# Patient Record
Sex: Male | Born: 1935 | Race: White | Hispanic: No | Marital: Single | Smoking: Former smoker
Health system: Southern US, Community
[De-identification: ages and names within clinical notes are randomized; demographics above are authoritative.]

## PROBLEM LIST (undated history)

## (undated) DIAGNOSIS — I10 Essential (primary) hypertension: Secondary | ICD-10-CM

## (undated) DIAGNOSIS — G4730 Sleep apnea, unspecified: Secondary | ICD-10-CM

## (undated) DIAGNOSIS — J449 Chronic obstructive pulmonary disease, unspecified: Secondary | ICD-10-CM

## (undated) DIAGNOSIS — J189 Pneumonia, unspecified organism: Secondary | ICD-10-CM

## (undated) DIAGNOSIS — M109 Gout, unspecified: Secondary | ICD-10-CM

## (undated) DIAGNOSIS — I499 Cardiac arrhythmia, unspecified: Secondary | ICD-10-CM

## (undated) HISTORY — PX: PROSTATE SURGERY: SHX751

## (undated) HISTORY — PX: KNEE SURGERY: SHX244

## (undated) HISTORY — DX: Pneumonia, unspecified organism: J18.9

## (undated) HISTORY — PX: BACK SURGERY: SHX140

---

## 2005-03-17 ENCOUNTER — Ambulatory Visit (HOSPITAL_BASED_OUTPATIENT_CLINIC_OR_DEPARTMENT_OTHER): Admission: RE | Admit: 2005-03-17 | Discharge: 2005-03-17 | Payer: Self-pay | Admitting: Orthopedic Surgery

## 2006-02-14 ENCOUNTER — Ambulatory Visit: Admission: RE | Admit: 2006-02-14 | Discharge: 2006-02-14 | Payer: Self-pay | Admitting: Orthopedic Surgery

## 2006-02-18 ENCOUNTER — Encounter: Admission: RE | Admit: 2006-02-18 | Discharge: 2006-02-18 | Payer: Self-pay | Admitting: Orthopedic Surgery

## 2006-05-30 ENCOUNTER — Inpatient Hospital Stay (HOSPITAL_COMMUNITY): Admission: RE | Admit: 2006-05-30 | Discharge: 2006-06-02 | Payer: Self-pay | Admitting: Orthopedic Surgery

## 2014-02-07 DIAGNOSIS — R251 Tremor, unspecified: Secondary | ICD-10-CM | POA: Diagnosis not present

## 2014-02-07 DIAGNOSIS — I4891 Unspecified atrial fibrillation: Secondary | ICD-10-CM | POA: Diagnosis not present

## 2014-02-07 DIAGNOSIS — J019 Acute sinusitis, unspecified: Secondary | ICD-10-CM | POA: Diagnosis not present

## 2014-02-07 DIAGNOSIS — L989 Disorder of the skin and subcutaneous tissue, unspecified: Secondary | ICD-10-CM | POA: Diagnosis not present

## 2014-04-15 DIAGNOSIS — Z6828 Body mass index (BMI) 28.0-28.9, adult: Secondary | ICD-10-CM | POA: Diagnosis not present

## 2014-04-15 DIAGNOSIS — H6120 Impacted cerumen, unspecified ear: Secondary | ICD-10-CM | POA: Diagnosis not present

## 2014-04-15 DIAGNOSIS — I4891 Unspecified atrial fibrillation: Secondary | ICD-10-CM | POA: Diagnosis not present

## 2014-04-24 DIAGNOSIS — Z0100 Encounter for examination of eyes and vision without abnormal findings: Secondary | ICD-10-CM | POA: Diagnosis not present

## 2014-05-16 DIAGNOSIS — E782 Mixed hyperlipidemia: Secondary | ICD-10-CM | POA: Diagnosis not present

## 2014-05-16 DIAGNOSIS — J449 Chronic obstructive pulmonary disease, unspecified: Secondary | ICD-10-CM | POA: Diagnosis not present

## 2014-05-16 DIAGNOSIS — I4891 Unspecified atrial fibrillation: Secondary | ICD-10-CM | POA: Diagnosis not present

## 2014-05-16 DIAGNOSIS — M109 Gout, unspecified: Secondary | ICD-10-CM | POA: Diagnosis not present

## 2014-05-30 DIAGNOSIS — Z6828 Body mass index (BMI) 28.0-28.9, adult: Secondary | ICD-10-CM | POA: Diagnosis not present

## 2014-06-04 DIAGNOSIS — K824 Cholesterolosis of gallbladder: Secondary | ICD-10-CM | POA: Diagnosis not present

## 2014-06-04 DIAGNOSIS — R7989 Other specified abnormal findings of blood chemistry: Secondary | ICD-10-CM | POA: Diagnosis not present

## 2014-06-04 DIAGNOSIS — R748 Abnormal levels of other serum enzymes: Secondary | ICD-10-CM | POA: Diagnosis not present

## 2014-06-17 DIAGNOSIS — I4891 Unspecified atrial fibrillation: Secondary | ICD-10-CM | POA: Diagnosis not present

## 2014-07-15 DIAGNOSIS — I4891 Unspecified atrial fibrillation: Secondary | ICD-10-CM | POA: Diagnosis not present

## 2014-07-19 DIAGNOSIS — Z6828 Body mass index (BMI) 28.0-28.9, adult: Secondary | ICD-10-CM | POA: Diagnosis not present

## 2014-08-14 DIAGNOSIS — E782 Mixed hyperlipidemia: Secondary | ICD-10-CM | POA: Diagnosis not present

## 2014-09-09 DIAGNOSIS — M1000 Idiopathic gout, unspecified site: Secondary | ICD-10-CM | POA: Diagnosis not present

## 2014-09-09 DIAGNOSIS — E782 Mixed hyperlipidemia: Secondary | ICD-10-CM | POA: Diagnosis not present

## 2014-09-09 DIAGNOSIS — J449 Chronic obstructive pulmonary disease, unspecified: Secondary | ICD-10-CM | POA: Diagnosis not present

## 2014-09-09 DIAGNOSIS — I4891 Unspecified atrial fibrillation: Secondary | ICD-10-CM | POA: Diagnosis not present

## 2014-10-15 DIAGNOSIS — I4891 Unspecified atrial fibrillation: Secondary | ICD-10-CM | POA: Diagnosis not present

## 2014-10-15 DIAGNOSIS — Z6827 Body mass index (BMI) 27.0-27.9, adult: Secondary | ICD-10-CM | POA: Diagnosis not present

## 2014-10-15 DIAGNOSIS — D485 Neoplasm of uncertain behavior of skin: Secondary | ICD-10-CM | POA: Diagnosis not present

## 2014-10-24 DIAGNOSIS — H3530 Unspecified macular degeneration: Secondary | ICD-10-CM | POA: Diagnosis not present

## 2014-11-14 DIAGNOSIS — Z139 Encounter for screening, unspecified: Secondary | ICD-10-CM | POA: Diagnosis not present

## 2014-11-14 DIAGNOSIS — I4891 Unspecified atrial fibrillation: Secondary | ICD-10-CM | POA: Diagnosis not present

## 2014-11-14 DIAGNOSIS — Z0000 Encounter for general adult medical examination without abnormal findings: Secondary | ICD-10-CM | POA: Diagnosis not present

## 2014-11-14 DIAGNOSIS — Z1389 Encounter for screening for other disorder: Secondary | ICD-10-CM | POA: Diagnosis not present

## 2014-11-14 DIAGNOSIS — Z9181 History of falling: Secondary | ICD-10-CM | POA: Diagnosis not present

## 2014-11-14 DIAGNOSIS — Z23 Encounter for immunization: Secondary | ICD-10-CM | POA: Diagnosis not present

## 2014-12-31 DIAGNOSIS — I4891 Unspecified atrial fibrillation: Secondary | ICD-10-CM | POA: Diagnosis not present

## 2015-01-13 DIAGNOSIS — J440 Chronic obstructive pulmonary disease with acute lower respiratory infection: Secondary | ICD-10-CM | POA: Diagnosis not present

## 2015-01-13 DIAGNOSIS — M109 Gout, unspecified: Secondary | ICD-10-CM | POA: Diagnosis not present

## 2015-01-13 DIAGNOSIS — R7301 Impaired fasting glucose: Secondary | ICD-10-CM | POA: Diagnosis not present

## 2015-01-13 DIAGNOSIS — I4891 Unspecified atrial fibrillation: Secondary | ICD-10-CM | POA: Diagnosis not present

## 2015-02-06 DIAGNOSIS — I4891 Unspecified atrial fibrillation: Secondary | ICD-10-CM | POA: Diagnosis not present

## 2015-03-11 DIAGNOSIS — I4891 Unspecified atrial fibrillation: Secondary | ICD-10-CM | POA: Diagnosis not present

## 2015-03-31 DIAGNOSIS — I4891 Unspecified atrial fibrillation: Secondary | ICD-10-CM | POA: Diagnosis not present

## 2015-04-02 DIAGNOSIS — R079 Chest pain, unspecified: Secondary | ICD-10-CM | POA: Diagnosis not present

## 2015-04-02 DIAGNOSIS — J8410 Pulmonary fibrosis, unspecified: Secondary | ICD-10-CM | POA: Diagnosis not present

## 2015-04-02 DIAGNOSIS — R05 Cough: Secondary | ICD-10-CM | POA: Diagnosis not present

## 2015-04-02 DIAGNOSIS — I517 Cardiomegaly: Secondary | ICD-10-CM | POA: Diagnosis not present

## 2015-04-08 DIAGNOSIS — R0602 Shortness of breath: Secondary | ICD-10-CM | POA: Diagnosis not present

## 2015-04-08 DIAGNOSIS — I4891 Unspecified atrial fibrillation: Secondary | ICD-10-CM | POA: Diagnosis not present

## 2015-04-08 DIAGNOSIS — R531 Weakness: Secondary | ICD-10-CM | POA: Diagnosis not present

## 2015-04-08 DIAGNOSIS — R509 Fever, unspecified: Secondary | ICD-10-CM | POA: Diagnosis not present

## 2015-04-15 DIAGNOSIS — Z6827 Body mass index (BMI) 27.0-27.9, adult: Secondary | ICD-10-CM | POA: Diagnosis not present

## 2015-04-15 DIAGNOSIS — J441 Chronic obstructive pulmonary disease with (acute) exacerbation: Secondary | ICD-10-CM | POA: Diagnosis not present

## 2015-04-15 DIAGNOSIS — J208 Acute bronchitis due to other specified organisms: Secondary | ICD-10-CM | POA: Diagnosis not present

## 2015-04-21 DIAGNOSIS — J9690 Respiratory failure, unspecified, unspecified whether with hypoxia or hypercapnia: Secondary | ICD-10-CM | POA: Diagnosis not present

## 2015-04-21 DIAGNOSIS — R0602 Shortness of breath: Secondary | ICD-10-CM | POA: Diagnosis not present

## 2015-04-21 DIAGNOSIS — J690 Pneumonitis due to inhalation of food and vomit: Secondary | ICD-10-CM | POA: Diagnosis not present

## 2015-04-21 DIAGNOSIS — R7989 Other specified abnormal findings of blood chemistry: Secondary | ICD-10-CM | POA: Diagnosis not present

## 2015-04-21 DIAGNOSIS — I509 Heart failure, unspecified: Secondary | ICD-10-CM | POA: Diagnosis not present

## 2015-04-21 DIAGNOSIS — J9601 Acute respiratory failure with hypoxia: Secondary | ICD-10-CM | POA: Diagnosis not present

## 2015-04-21 DIAGNOSIS — I482 Chronic atrial fibrillation: Secondary | ICD-10-CM | POA: Diagnosis not present

## 2015-04-21 DIAGNOSIS — A419 Sepsis, unspecified organism: Secondary | ICD-10-CM | POA: Diagnosis not present

## 2015-04-21 DIAGNOSIS — J189 Pneumonia, unspecified organism: Secondary | ICD-10-CM | POA: Diagnosis not present

## 2015-04-21 DIAGNOSIS — J440 Chronic obstructive pulmonary disease with acute lower respiratory infection: Secondary | ICD-10-CM | POA: Diagnosis not present

## 2015-04-21 DIAGNOSIS — R6520 Severe sepsis without septic shock: Secondary | ICD-10-CM | POA: Diagnosis not present

## 2015-04-21 DIAGNOSIS — I10 Essential (primary) hypertension: Secondary | ICD-10-CM | POA: Diagnosis not present

## 2015-04-21 DIAGNOSIS — J9620 Acute and chronic respiratory failure, unspecified whether with hypoxia or hypercapnia: Secondary | ICD-10-CM | POA: Diagnosis not present

## 2015-04-21 DIAGNOSIS — E7800 Pure hypercholesterolemia, unspecified: Secondary | ICD-10-CM | POA: Diagnosis not present

## 2015-04-21 DIAGNOSIS — J441 Chronic obstructive pulmonary disease with (acute) exacerbation: Secondary | ICD-10-CM | POA: Diagnosis not present

## 2015-04-28 DIAGNOSIS — M5432 Sciatica, left side: Secondary | ICD-10-CM | POA: Diagnosis not present

## 2015-04-28 DIAGNOSIS — J690 Pneumonitis due to inhalation of food and vomit: Secondary | ICD-10-CM | POA: Diagnosis not present

## 2015-04-28 DIAGNOSIS — M150 Primary generalized (osteo)arthritis: Secondary | ICD-10-CM | POA: Diagnosis not present

## 2015-04-28 DIAGNOSIS — I4891 Unspecified atrial fibrillation: Secondary | ICD-10-CM | POA: Diagnosis not present

## 2015-04-28 DIAGNOSIS — J440 Chronic obstructive pulmonary disease with acute lower respiratory infection: Secondary | ICD-10-CM | POA: Diagnosis not present

## 2015-04-28 DIAGNOSIS — J9601 Acute respiratory failure with hypoxia: Secondary | ICD-10-CM | POA: Diagnosis not present

## 2015-04-28 DIAGNOSIS — R2681 Unsteadiness on feet: Secondary | ICD-10-CM | POA: Diagnosis not present

## 2015-04-28 DIAGNOSIS — S50912D Unspecified superficial injury of left forearm, subsequent encounter: Secondary | ICD-10-CM | POA: Diagnosis not present

## 2015-04-28 DIAGNOSIS — J441 Chronic obstructive pulmonary disease with (acute) exacerbation: Secondary | ICD-10-CM | POA: Diagnosis not present

## 2015-04-29 DIAGNOSIS — J441 Chronic obstructive pulmonary disease with (acute) exacerbation: Secondary | ICD-10-CM | POA: Diagnosis not present

## 2015-04-29 DIAGNOSIS — J690 Pneumonitis due to inhalation of food and vomit: Secondary | ICD-10-CM | POA: Diagnosis not present

## 2015-04-29 DIAGNOSIS — Z7901 Long term (current) use of anticoagulants: Secondary | ICD-10-CM | POA: Diagnosis not present

## 2015-04-29 DIAGNOSIS — S50912D Unspecified superficial injury of left forearm, subsequent encounter: Secondary | ICD-10-CM | POA: Diagnosis not present

## 2015-04-29 DIAGNOSIS — J9601 Acute respiratory failure with hypoxia: Secondary | ICD-10-CM | POA: Diagnosis not present

## 2015-04-29 DIAGNOSIS — I4891 Unspecified atrial fibrillation: Secondary | ICD-10-CM | POA: Diagnosis not present

## 2015-04-29 DIAGNOSIS — J440 Chronic obstructive pulmonary disease with acute lower respiratory infection: Secondary | ICD-10-CM | POA: Diagnosis not present

## 2015-04-29 DIAGNOSIS — M5432 Sciatica, left side: Secondary | ICD-10-CM | POA: Diagnosis not present

## 2015-04-29 DIAGNOSIS — R2681 Unsteadiness on feet: Secondary | ICD-10-CM | POA: Diagnosis not present

## 2015-04-29 DIAGNOSIS — Z79899 Other long term (current) drug therapy: Secondary | ICD-10-CM | POA: Diagnosis not present

## 2015-04-29 DIAGNOSIS — M150 Primary generalized (osteo)arthritis: Secondary | ICD-10-CM | POA: Diagnosis not present

## 2015-04-30 DIAGNOSIS — S50912D Unspecified superficial injury of left forearm, subsequent encounter: Secondary | ICD-10-CM | POA: Diagnosis not present

## 2015-04-30 DIAGNOSIS — J441 Chronic obstructive pulmonary disease with (acute) exacerbation: Secondary | ICD-10-CM | POA: Diagnosis not present

## 2015-04-30 DIAGNOSIS — S51812D Laceration without foreign body of left forearm, subsequent encounter: Secondary | ICD-10-CM | POA: Diagnosis not present

## 2015-04-30 DIAGNOSIS — J440 Chronic obstructive pulmonary disease with acute lower respiratory infection: Secondary | ICD-10-CM | POA: Diagnosis not present

## 2015-04-30 DIAGNOSIS — M5432 Sciatica, left side: Secondary | ICD-10-CM | POA: Diagnosis not present

## 2015-04-30 DIAGNOSIS — I4891 Unspecified atrial fibrillation: Secondary | ICD-10-CM | POA: Diagnosis not present

## 2015-04-30 DIAGNOSIS — J690 Pneumonitis due to inhalation of food and vomit: Secondary | ICD-10-CM | POA: Diagnosis not present

## 2015-04-30 DIAGNOSIS — J9601 Acute respiratory failure with hypoxia: Secondary | ICD-10-CM | POA: Diagnosis not present

## 2015-04-30 DIAGNOSIS — J159 Unspecified bacterial pneumonia: Secondary | ICD-10-CM | POA: Diagnosis not present

## 2015-04-30 DIAGNOSIS — M150 Primary generalized (osteo)arthritis: Secondary | ICD-10-CM | POA: Diagnosis not present

## 2015-04-30 DIAGNOSIS — R2681 Unsteadiness on feet: Secondary | ICD-10-CM | POA: Diagnosis not present

## 2015-05-02 DIAGNOSIS — M5432 Sciatica, left side: Secondary | ICD-10-CM | POA: Diagnosis not present

## 2015-05-02 DIAGNOSIS — J441 Chronic obstructive pulmonary disease with (acute) exacerbation: Secondary | ICD-10-CM | POA: Diagnosis not present

## 2015-05-02 DIAGNOSIS — J690 Pneumonitis due to inhalation of food and vomit: Secondary | ICD-10-CM | POA: Diagnosis not present

## 2015-05-02 DIAGNOSIS — M150 Primary generalized (osteo)arthritis: Secondary | ICD-10-CM | POA: Diagnosis not present

## 2015-05-02 DIAGNOSIS — J440 Chronic obstructive pulmonary disease with acute lower respiratory infection: Secondary | ICD-10-CM | POA: Diagnosis not present

## 2015-05-02 DIAGNOSIS — R2681 Unsteadiness on feet: Secondary | ICD-10-CM | POA: Diagnosis not present

## 2015-05-02 DIAGNOSIS — J9601 Acute respiratory failure with hypoxia: Secondary | ICD-10-CM | POA: Diagnosis not present

## 2015-05-02 DIAGNOSIS — S50912D Unspecified superficial injury of left forearm, subsequent encounter: Secondary | ICD-10-CM | POA: Diagnosis not present

## 2015-05-02 DIAGNOSIS — I4891 Unspecified atrial fibrillation: Secondary | ICD-10-CM | POA: Diagnosis not present

## 2015-05-05 DIAGNOSIS — Z7901 Long term (current) use of anticoagulants: Secondary | ICD-10-CM | POA: Diagnosis not present

## 2015-05-05 DIAGNOSIS — R2681 Unsteadiness on feet: Secondary | ICD-10-CM | POA: Diagnosis not present

## 2015-05-05 DIAGNOSIS — M150 Primary generalized (osteo)arthritis: Secondary | ICD-10-CM | POA: Diagnosis not present

## 2015-05-05 DIAGNOSIS — J690 Pneumonitis due to inhalation of food and vomit: Secondary | ICD-10-CM | POA: Diagnosis not present

## 2015-05-05 DIAGNOSIS — J441 Chronic obstructive pulmonary disease with (acute) exacerbation: Secondary | ICD-10-CM | POA: Diagnosis not present

## 2015-05-05 DIAGNOSIS — I4891 Unspecified atrial fibrillation: Secondary | ICD-10-CM | POA: Diagnosis not present

## 2015-05-05 DIAGNOSIS — J440 Chronic obstructive pulmonary disease with acute lower respiratory infection: Secondary | ICD-10-CM | POA: Diagnosis not present

## 2015-05-05 DIAGNOSIS — J9601 Acute respiratory failure with hypoxia: Secondary | ICD-10-CM | POA: Diagnosis not present

## 2015-05-05 DIAGNOSIS — S50912D Unspecified superficial injury of left forearm, subsequent encounter: Secondary | ICD-10-CM | POA: Diagnosis not present

## 2015-05-05 DIAGNOSIS — M5432 Sciatica, left side: Secondary | ICD-10-CM | POA: Diagnosis not present

## 2015-05-06 DIAGNOSIS — I517 Cardiomegaly: Secondary | ICD-10-CM | POA: Diagnosis not present

## 2015-05-06 DIAGNOSIS — J90 Pleural effusion, not elsewhere classified: Secondary | ICD-10-CM | POA: Diagnosis not present

## 2015-05-06 DIAGNOSIS — J189 Pneumonia, unspecified organism: Secondary | ICD-10-CM | POA: Diagnosis not present

## 2015-05-07 DIAGNOSIS — M150 Primary generalized (osteo)arthritis: Secondary | ICD-10-CM | POA: Diagnosis not present

## 2015-05-07 DIAGNOSIS — S50912D Unspecified superficial injury of left forearm, subsequent encounter: Secondary | ICD-10-CM | POA: Diagnosis not present

## 2015-05-07 DIAGNOSIS — J441 Chronic obstructive pulmonary disease with (acute) exacerbation: Secondary | ICD-10-CM | POA: Diagnosis not present

## 2015-05-07 DIAGNOSIS — M5432 Sciatica, left side: Secondary | ICD-10-CM | POA: Diagnosis not present

## 2015-05-07 DIAGNOSIS — J440 Chronic obstructive pulmonary disease with acute lower respiratory infection: Secondary | ICD-10-CM | POA: Diagnosis not present

## 2015-05-07 DIAGNOSIS — I4891 Unspecified atrial fibrillation: Secondary | ICD-10-CM | POA: Diagnosis not present

## 2015-05-07 DIAGNOSIS — J690 Pneumonitis due to inhalation of food and vomit: Secondary | ICD-10-CM | POA: Diagnosis not present

## 2015-05-07 DIAGNOSIS — R2681 Unsteadiness on feet: Secondary | ICD-10-CM | POA: Diagnosis not present

## 2015-05-07 DIAGNOSIS — J9601 Acute respiratory failure with hypoxia: Secondary | ICD-10-CM | POA: Diagnosis not present

## 2015-05-08 DIAGNOSIS — S50912D Unspecified superficial injury of left forearm, subsequent encounter: Secondary | ICD-10-CM | POA: Diagnosis not present

## 2015-05-08 DIAGNOSIS — I4891 Unspecified atrial fibrillation: Secondary | ICD-10-CM | POA: Diagnosis not present

## 2015-05-08 DIAGNOSIS — J441 Chronic obstructive pulmonary disease with (acute) exacerbation: Secondary | ICD-10-CM | POA: Diagnosis not present

## 2015-05-08 DIAGNOSIS — R2681 Unsteadiness on feet: Secondary | ICD-10-CM | POA: Diagnosis not present

## 2015-05-08 DIAGNOSIS — J440 Chronic obstructive pulmonary disease with acute lower respiratory infection: Secondary | ICD-10-CM | POA: Diagnosis not present

## 2015-05-08 DIAGNOSIS — M150 Primary generalized (osteo)arthritis: Secondary | ICD-10-CM | POA: Diagnosis not present

## 2015-05-08 DIAGNOSIS — J9601 Acute respiratory failure with hypoxia: Secondary | ICD-10-CM | POA: Diagnosis not present

## 2015-05-08 DIAGNOSIS — M5432 Sciatica, left side: Secondary | ICD-10-CM | POA: Diagnosis not present

## 2015-05-08 DIAGNOSIS — J690 Pneumonitis due to inhalation of food and vomit: Secondary | ICD-10-CM | POA: Diagnosis not present

## 2015-05-12 DIAGNOSIS — J690 Pneumonitis due to inhalation of food and vomit: Secondary | ICD-10-CM | POA: Diagnosis not present

## 2015-05-12 DIAGNOSIS — I4891 Unspecified atrial fibrillation: Secondary | ICD-10-CM | POA: Diagnosis not present

## 2015-05-12 DIAGNOSIS — M5432 Sciatica, left side: Secondary | ICD-10-CM | POA: Diagnosis not present

## 2015-05-12 DIAGNOSIS — M150 Primary generalized (osteo)arthritis: Secondary | ICD-10-CM | POA: Diagnosis not present

## 2015-05-12 DIAGNOSIS — J440 Chronic obstructive pulmonary disease with acute lower respiratory infection: Secondary | ICD-10-CM | POA: Diagnosis not present

## 2015-05-12 DIAGNOSIS — Z7901 Long term (current) use of anticoagulants: Secondary | ICD-10-CM | POA: Diagnosis not present

## 2015-05-12 DIAGNOSIS — R2681 Unsteadiness on feet: Secondary | ICD-10-CM | POA: Diagnosis not present

## 2015-05-12 DIAGNOSIS — J9601 Acute respiratory failure with hypoxia: Secondary | ICD-10-CM | POA: Diagnosis not present

## 2015-05-12 DIAGNOSIS — J441 Chronic obstructive pulmonary disease with (acute) exacerbation: Secondary | ICD-10-CM | POA: Diagnosis not present

## 2015-05-12 DIAGNOSIS — S50912D Unspecified superficial injury of left forearm, subsequent encounter: Secondary | ICD-10-CM | POA: Diagnosis not present

## 2015-05-13 DIAGNOSIS — R2681 Unsteadiness on feet: Secondary | ICD-10-CM | POA: Diagnosis not present

## 2015-05-13 DIAGNOSIS — S50912D Unspecified superficial injury of left forearm, subsequent encounter: Secondary | ICD-10-CM | POA: Diagnosis not present

## 2015-05-13 DIAGNOSIS — M150 Primary generalized (osteo)arthritis: Secondary | ICD-10-CM | POA: Diagnosis not present

## 2015-05-13 DIAGNOSIS — J690 Pneumonitis due to inhalation of food and vomit: Secondary | ICD-10-CM | POA: Diagnosis not present

## 2015-05-13 DIAGNOSIS — J9601 Acute respiratory failure with hypoxia: Secondary | ICD-10-CM | POA: Diagnosis not present

## 2015-05-13 DIAGNOSIS — J441 Chronic obstructive pulmonary disease with (acute) exacerbation: Secondary | ICD-10-CM | POA: Diagnosis not present

## 2015-05-13 DIAGNOSIS — J440 Chronic obstructive pulmonary disease with acute lower respiratory infection: Secondary | ICD-10-CM | POA: Diagnosis not present

## 2015-05-13 DIAGNOSIS — M5432 Sciatica, left side: Secondary | ICD-10-CM | POA: Diagnosis not present

## 2015-05-13 DIAGNOSIS — I4891 Unspecified atrial fibrillation: Secondary | ICD-10-CM | POA: Diagnosis not present

## 2015-05-15 DIAGNOSIS — J690 Pneumonitis due to inhalation of food and vomit: Secondary | ICD-10-CM | POA: Diagnosis not present

## 2015-05-15 DIAGNOSIS — R2681 Unsteadiness on feet: Secondary | ICD-10-CM | POA: Diagnosis not present

## 2015-05-15 DIAGNOSIS — I4891 Unspecified atrial fibrillation: Secondary | ICD-10-CM | POA: Diagnosis not present

## 2015-05-15 DIAGNOSIS — S50912D Unspecified superficial injury of left forearm, subsequent encounter: Secondary | ICD-10-CM | POA: Diagnosis not present

## 2015-05-15 DIAGNOSIS — M150 Primary generalized (osteo)arthritis: Secondary | ICD-10-CM | POA: Diagnosis not present

## 2015-05-15 DIAGNOSIS — J441 Chronic obstructive pulmonary disease with (acute) exacerbation: Secondary | ICD-10-CM | POA: Diagnosis not present

## 2015-05-15 DIAGNOSIS — M5432 Sciatica, left side: Secondary | ICD-10-CM | POA: Diagnosis not present

## 2015-05-15 DIAGNOSIS — J440 Chronic obstructive pulmonary disease with acute lower respiratory infection: Secondary | ICD-10-CM | POA: Diagnosis not present

## 2015-05-15 DIAGNOSIS — J9601 Acute respiratory failure with hypoxia: Secondary | ICD-10-CM | POA: Diagnosis not present

## 2015-05-19 DIAGNOSIS — I4891 Unspecified atrial fibrillation: Secondary | ICD-10-CM | POA: Diagnosis not present

## 2015-05-19 DIAGNOSIS — R2681 Unsteadiness on feet: Secondary | ICD-10-CM | POA: Diagnosis not present

## 2015-05-19 DIAGNOSIS — J690 Pneumonitis due to inhalation of food and vomit: Secondary | ICD-10-CM | POA: Diagnosis not present

## 2015-05-19 DIAGNOSIS — S50912D Unspecified superficial injury of left forearm, subsequent encounter: Secondary | ICD-10-CM | POA: Diagnosis not present

## 2015-05-19 DIAGNOSIS — M5432 Sciatica, left side: Secondary | ICD-10-CM | POA: Diagnosis not present

## 2015-05-19 DIAGNOSIS — J441 Chronic obstructive pulmonary disease with (acute) exacerbation: Secondary | ICD-10-CM | POA: Diagnosis not present

## 2015-05-19 DIAGNOSIS — J9601 Acute respiratory failure with hypoxia: Secondary | ICD-10-CM | POA: Diagnosis not present

## 2015-05-19 DIAGNOSIS — M150 Primary generalized (osteo)arthritis: Secondary | ICD-10-CM | POA: Diagnosis not present

## 2015-05-19 DIAGNOSIS — J440 Chronic obstructive pulmonary disease with acute lower respiratory infection: Secondary | ICD-10-CM | POA: Diagnosis not present

## 2015-05-20 DIAGNOSIS — J690 Pneumonitis due to inhalation of food and vomit: Secondary | ICD-10-CM | POA: Diagnosis not present

## 2015-05-20 DIAGNOSIS — R2681 Unsteadiness on feet: Secondary | ICD-10-CM | POA: Diagnosis not present

## 2015-05-20 DIAGNOSIS — J441 Chronic obstructive pulmonary disease with (acute) exacerbation: Secondary | ICD-10-CM | POA: Diagnosis not present

## 2015-05-20 DIAGNOSIS — I4891 Unspecified atrial fibrillation: Secondary | ICD-10-CM | POA: Diagnosis not present

## 2015-05-20 DIAGNOSIS — S50912D Unspecified superficial injury of left forearm, subsequent encounter: Secondary | ICD-10-CM | POA: Diagnosis not present

## 2015-05-20 DIAGNOSIS — J9601 Acute respiratory failure with hypoxia: Secondary | ICD-10-CM | POA: Diagnosis not present

## 2015-05-20 DIAGNOSIS — M5432 Sciatica, left side: Secondary | ICD-10-CM | POA: Diagnosis not present

## 2015-05-20 DIAGNOSIS — M150 Primary generalized (osteo)arthritis: Secondary | ICD-10-CM | POA: Diagnosis not present

## 2015-05-20 DIAGNOSIS — J440 Chronic obstructive pulmonary disease with acute lower respiratory infection: Secondary | ICD-10-CM | POA: Diagnosis not present

## 2015-05-21 DIAGNOSIS — I4891 Unspecified atrial fibrillation: Secondary | ICD-10-CM | POA: Diagnosis not present

## 2015-05-21 DIAGNOSIS — J441 Chronic obstructive pulmonary disease with (acute) exacerbation: Secondary | ICD-10-CM | POA: Diagnosis not present

## 2015-05-21 DIAGNOSIS — M5432 Sciatica, left side: Secondary | ICD-10-CM | POA: Diagnosis not present

## 2015-05-21 DIAGNOSIS — M150 Primary generalized (osteo)arthritis: Secondary | ICD-10-CM | POA: Diagnosis not present

## 2015-05-21 DIAGNOSIS — J9601 Acute respiratory failure with hypoxia: Secondary | ICD-10-CM | POA: Diagnosis not present

## 2015-05-21 DIAGNOSIS — J440 Chronic obstructive pulmonary disease with acute lower respiratory infection: Secondary | ICD-10-CM | POA: Diagnosis not present

## 2015-05-21 DIAGNOSIS — J690 Pneumonitis due to inhalation of food and vomit: Secondary | ICD-10-CM | POA: Diagnosis not present

## 2015-05-21 DIAGNOSIS — R2681 Unsteadiness on feet: Secondary | ICD-10-CM | POA: Diagnosis not present

## 2015-05-21 DIAGNOSIS — S50912D Unspecified superficial injury of left forearm, subsequent encounter: Secondary | ICD-10-CM | POA: Diagnosis not present

## 2015-05-26 DIAGNOSIS — J9601 Acute respiratory failure with hypoxia: Secondary | ICD-10-CM | POA: Diagnosis not present

## 2015-05-26 DIAGNOSIS — I4891 Unspecified atrial fibrillation: Secondary | ICD-10-CM | POA: Diagnosis not present

## 2015-05-26 DIAGNOSIS — R2681 Unsteadiness on feet: Secondary | ICD-10-CM | POA: Diagnosis not present

## 2015-05-26 DIAGNOSIS — S50912D Unspecified superficial injury of left forearm, subsequent encounter: Secondary | ICD-10-CM | POA: Diagnosis not present

## 2015-05-26 DIAGNOSIS — J441 Chronic obstructive pulmonary disease with (acute) exacerbation: Secondary | ICD-10-CM | POA: Diagnosis not present

## 2015-05-26 DIAGNOSIS — J440 Chronic obstructive pulmonary disease with acute lower respiratory infection: Secondary | ICD-10-CM | POA: Diagnosis not present

## 2015-05-26 DIAGNOSIS — J690 Pneumonitis due to inhalation of food and vomit: Secondary | ICD-10-CM | POA: Diagnosis not present

## 2015-05-26 DIAGNOSIS — M5432 Sciatica, left side: Secondary | ICD-10-CM | POA: Diagnosis not present

## 2015-05-26 DIAGNOSIS — M150 Primary generalized (osteo)arthritis: Secondary | ICD-10-CM | POA: Diagnosis not present

## 2015-05-29 DIAGNOSIS — I4891 Unspecified atrial fibrillation: Secondary | ICD-10-CM | POA: Diagnosis not present

## 2015-06-02 DIAGNOSIS — H3530 Unspecified macular degeneration: Secondary | ICD-10-CM | POA: Diagnosis not present

## 2015-06-20 DIAGNOSIS — H3530 Unspecified macular degeneration: Secondary | ICD-10-CM | POA: Diagnosis not present

## 2015-06-20 DIAGNOSIS — Z6826 Body mass index (BMI) 26.0-26.9, adult: Secondary | ICD-10-CM | POA: Diagnosis not present

## 2015-07-02 DIAGNOSIS — S81801A Unspecified open wound, right lower leg, initial encounter: Secondary | ICD-10-CM | POA: Diagnosis not present

## 2015-07-02 DIAGNOSIS — Z6826 Body mass index (BMI) 26.0-26.9, adult: Secondary | ICD-10-CM | POA: Diagnosis not present

## 2015-07-02 DIAGNOSIS — I4891 Unspecified atrial fibrillation: Secondary | ICD-10-CM | POA: Diagnosis not present

## 2015-07-02 DIAGNOSIS — I878 Other specified disorders of veins: Secondary | ICD-10-CM | POA: Diagnosis not present

## 2015-07-15 DIAGNOSIS — I4891 Unspecified atrial fibrillation: Secondary | ICD-10-CM | POA: Diagnosis not present

## 2015-07-15 DIAGNOSIS — S51812D Laceration without foreign body of left forearm, subsequent encounter: Secondary | ICD-10-CM | POA: Diagnosis not present

## 2015-07-15 DIAGNOSIS — Z6827 Body mass index (BMI) 27.0-27.9, adult: Secondary | ICD-10-CM | POA: Diagnosis not present

## 2015-07-24 DIAGNOSIS — H353132 Nonexudative age-related macular degeneration, bilateral, intermediate dry stage: Secondary | ICD-10-CM | POA: Diagnosis not present

## 2015-08-14 DIAGNOSIS — Z6827 Body mass index (BMI) 27.0-27.9, adult: Secondary | ICD-10-CM | POA: Diagnosis not present

## 2015-08-14 DIAGNOSIS — I878 Other specified disorders of veins: Secondary | ICD-10-CM | POA: Diagnosis not present

## 2015-08-14 DIAGNOSIS — I4891 Unspecified atrial fibrillation: Secondary | ICD-10-CM | POA: Diagnosis not present

## 2015-09-18 DIAGNOSIS — Z6827 Body mass index (BMI) 27.0-27.9, adult: Secondary | ICD-10-CM | POA: Diagnosis not present

## 2015-09-18 DIAGNOSIS — D684 Acquired coagulation factor deficiency: Secondary | ICD-10-CM | POA: Diagnosis not present

## 2015-09-18 DIAGNOSIS — I878 Other specified disorders of veins: Secondary | ICD-10-CM | POA: Diagnosis not present

## 2015-10-17 DIAGNOSIS — I4891 Unspecified atrial fibrillation: Secondary | ICD-10-CM | POA: Diagnosis not present

## 2015-10-17 DIAGNOSIS — J441 Chronic obstructive pulmonary disease with (acute) exacerbation: Secondary | ICD-10-CM | POA: Diagnosis not present

## 2015-10-17 DIAGNOSIS — Z23 Encounter for immunization: Secondary | ICD-10-CM | POA: Diagnosis not present

## 2015-11-14 DIAGNOSIS — J449 Chronic obstructive pulmonary disease, unspecified: Secondary | ICD-10-CM | POA: Diagnosis not present

## 2015-11-14 DIAGNOSIS — I4891 Unspecified atrial fibrillation: Secondary | ICD-10-CM | POA: Diagnosis not present

## 2015-11-14 DIAGNOSIS — I878 Other specified disorders of veins: Secondary | ICD-10-CM | POA: Diagnosis not present

## 2015-11-14 DIAGNOSIS — M109 Gout, unspecified: Secondary | ICD-10-CM | POA: Diagnosis not present

## 2015-11-27 DIAGNOSIS — H353132 Nonexudative age-related macular degeneration, bilateral, intermediate dry stage: Secondary | ICD-10-CM | POA: Diagnosis not present

## 2015-12-16 DIAGNOSIS — I509 Heart failure, unspecified: Secondary | ICD-10-CM | POA: Diagnosis not present

## 2015-12-16 DIAGNOSIS — Z125 Encounter for screening for malignant neoplasm of prostate: Secondary | ICD-10-CM | POA: Diagnosis not present

## 2015-12-16 DIAGNOSIS — I4891 Unspecified atrial fibrillation: Secondary | ICD-10-CM | POA: Diagnosis not present

## 2015-12-16 DIAGNOSIS — M1990 Unspecified osteoarthritis, unspecified site: Secondary | ICD-10-CM | POA: Diagnosis not present

## 2015-12-16 DIAGNOSIS — J449 Chronic obstructive pulmonary disease, unspecified: Secondary | ICD-10-CM | POA: Diagnosis not present

## 2015-12-22 DIAGNOSIS — I4891 Unspecified atrial fibrillation: Secondary | ICD-10-CM | POA: Diagnosis not present

## 2015-12-22 DIAGNOSIS — Z6827 Body mass index (BMI) 27.0-27.9, adult: Secondary | ICD-10-CM | POA: Diagnosis not present

## 2016-01-02 DIAGNOSIS — H6123 Impacted cerumen, bilateral: Secondary | ICD-10-CM | POA: Diagnosis not present

## 2016-01-02 DIAGNOSIS — Z6827 Body mass index (BMI) 27.0-27.9, adult: Secondary | ICD-10-CM | POA: Diagnosis not present

## 2016-01-02 DIAGNOSIS — I4891 Unspecified atrial fibrillation: Secondary | ICD-10-CM | POA: Diagnosis not present

## 2016-02-09 DIAGNOSIS — J209 Acute bronchitis, unspecified: Secondary | ICD-10-CM | POA: Diagnosis not present

## 2016-02-09 DIAGNOSIS — J440 Chronic obstructive pulmonary disease with acute lower respiratory infection: Secondary | ICD-10-CM | POA: Diagnosis not present

## 2016-02-09 DIAGNOSIS — I4891 Unspecified atrial fibrillation: Secondary | ICD-10-CM | POA: Diagnosis not present

## 2016-03-15 DIAGNOSIS — J209 Acute bronchitis, unspecified: Secondary | ICD-10-CM | POA: Diagnosis not present

## 2016-03-15 DIAGNOSIS — I4891 Unspecified atrial fibrillation: Secondary | ICD-10-CM | POA: Diagnosis not present

## 2016-03-15 DIAGNOSIS — Z6827 Body mass index (BMI) 27.0-27.9, adult: Secondary | ICD-10-CM | POA: Diagnosis not present

## 2016-03-15 DIAGNOSIS — J440 Chronic obstructive pulmonary disease with acute lower respiratory infection: Secondary | ICD-10-CM | POA: Diagnosis not present

## 2016-04-12 DIAGNOSIS — I4891 Unspecified atrial fibrillation: Secondary | ICD-10-CM | POA: Diagnosis not present

## 2016-04-12 DIAGNOSIS — Z6827 Body mass index (BMI) 27.0-27.9, adult: Secondary | ICD-10-CM | POA: Diagnosis not present

## 2016-05-12 DIAGNOSIS — Z6828 Body mass index (BMI) 28.0-28.9, adult: Secondary | ICD-10-CM | POA: Diagnosis not present

## 2016-05-12 DIAGNOSIS — Z0000 Encounter for general adult medical examination without abnormal findings: Secondary | ICD-10-CM | POA: Diagnosis not present

## 2016-05-12 DIAGNOSIS — I4891 Unspecified atrial fibrillation: Secondary | ICD-10-CM | POA: Diagnosis not present

## 2016-05-12 DIAGNOSIS — Z139 Encounter for screening, unspecified: Secondary | ICD-10-CM | POA: Diagnosis not present

## 2016-06-14 DIAGNOSIS — Z6830 Body mass index (BMI) 30.0-30.9, adult: Secondary | ICD-10-CM | POA: Diagnosis not present

## 2016-06-14 DIAGNOSIS — Z7901 Long term (current) use of anticoagulants: Secondary | ICD-10-CM | POA: Diagnosis not present

## 2016-06-14 DIAGNOSIS — Z5181 Encounter for therapeutic drug level monitoring: Secondary | ICD-10-CM | POA: Diagnosis not present

## 2016-06-14 DIAGNOSIS — I4891 Unspecified atrial fibrillation: Secondary | ICD-10-CM | POA: Diagnosis not present

## 2016-06-15 DIAGNOSIS — Z961 Presence of intraocular lens: Secondary | ICD-10-CM | POA: Diagnosis not present

## 2016-06-15 DIAGNOSIS — H353132 Nonexudative age-related macular degeneration, bilateral, intermediate dry stage: Secondary | ICD-10-CM | POA: Diagnosis not present

## 2016-06-15 DIAGNOSIS — Z0100 Encounter for examination of eyes and vision without abnormal findings: Secondary | ICD-10-CM | POA: Diagnosis not present

## 2016-07-13 DIAGNOSIS — Z7901 Long term (current) use of anticoagulants: Secondary | ICD-10-CM | POA: Diagnosis not present

## 2016-07-13 DIAGNOSIS — I4891 Unspecified atrial fibrillation: Secondary | ICD-10-CM | POA: Diagnosis not present

## 2016-07-13 DIAGNOSIS — Z6829 Body mass index (BMI) 29.0-29.9, adult: Secondary | ICD-10-CM | POA: Diagnosis not present

## 2016-07-13 DIAGNOSIS — L989 Disorder of the skin and subcutaneous tissue, unspecified: Secondary | ICD-10-CM | POA: Diagnosis not present

## 2016-08-03 DIAGNOSIS — L821 Other seborrheic keratosis: Secondary | ICD-10-CM | POA: Diagnosis not present

## 2016-08-03 DIAGNOSIS — C44612 Basal cell carcinoma of skin of right upper limb, including shoulder: Secondary | ICD-10-CM | POA: Diagnosis not present

## 2016-08-03 DIAGNOSIS — C44622 Squamous cell carcinoma of skin of right upper limb, including shoulder: Secondary | ICD-10-CM | POA: Diagnosis not present

## 2016-08-12 DIAGNOSIS — I4891 Unspecified atrial fibrillation: Secondary | ICD-10-CM | POA: Diagnosis not present

## 2016-08-12 DIAGNOSIS — Z5181 Encounter for therapeutic drug level monitoring: Secondary | ICD-10-CM | POA: Diagnosis not present

## 2016-08-12 DIAGNOSIS — G629 Polyneuropathy, unspecified: Secondary | ICD-10-CM | POA: Diagnosis not present

## 2016-08-12 DIAGNOSIS — Z1389 Encounter for screening for other disorder: Secondary | ICD-10-CM | POA: Diagnosis not present

## 2016-08-12 DIAGNOSIS — Z7901 Long term (current) use of anticoagulants: Secondary | ICD-10-CM | POA: Diagnosis not present

## 2016-08-12 DIAGNOSIS — Z139 Encounter for screening, unspecified: Secondary | ICD-10-CM | POA: Diagnosis not present

## 2016-08-31 DIAGNOSIS — Z6829 Body mass index (BMI) 29.0-29.9, adult: Secondary | ICD-10-CM | POA: Diagnosis not present

## 2016-08-31 DIAGNOSIS — S41102A Unspecified open wound of left upper arm, initial encounter: Secondary | ICD-10-CM | POA: Diagnosis not present

## 2016-08-31 DIAGNOSIS — Z139 Encounter for screening, unspecified: Secondary | ICD-10-CM | POA: Diagnosis not present

## 2016-09-01 DIAGNOSIS — C44622 Squamous cell carcinoma of skin of right upper limb, including shoulder: Secondary | ICD-10-CM | POA: Diagnosis not present

## 2016-09-13 DIAGNOSIS — Z6829 Body mass index (BMI) 29.0-29.9, adult: Secondary | ICD-10-CM | POA: Diagnosis not present

## 2016-09-13 DIAGNOSIS — Z7189 Other specified counseling: Secondary | ICD-10-CM | POA: Diagnosis not present

## 2016-09-13 DIAGNOSIS — Z7901 Long term (current) use of anticoagulants: Secondary | ICD-10-CM | POA: Diagnosis not present

## 2016-09-13 DIAGNOSIS — I4891 Unspecified atrial fibrillation: Secondary | ICD-10-CM | POA: Diagnosis not present

## 2016-09-13 DIAGNOSIS — Z1389 Encounter for screening for other disorder: Secondary | ICD-10-CM | POA: Diagnosis not present

## 2016-09-13 DIAGNOSIS — Z0000 Encounter for general adult medical examination without abnormal findings: Secondary | ICD-10-CM | POA: Diagnosis not present

## 2016-09-13 DIAGNOSIS — Z139 Encounter for screening, unspecified: Secondary | ICD-10-CM | POA: Diagnosis not present

## 2016-10-15 DIAGNOSIS — Z6830 Body mass index (BMI) 30.0-30.9, adult: Secondary | ICD-10-CM | POA: Diagnosis not present

## 2016-10-15 DIAGNOSIS — G629 Polyneuropathy, unspecified: Secondary | ICD-10-CM | POA: Diagnosis not present

## 2016-10-15 DIAGNOSIS — Z23 Encounter for immunization: Secondary | ICD-10-CM | POA: Diagnosis not present

## 2016-10-15 DIAGNOSIS — I4891 Unspecified atrial fibrillation: Secondary | ICD-10-CM | POA: Diagnosis not present

## 2016-10-15 DIAGNOSIS — H6120 Impacted cerumen, unspecified ear: Secondary | ICD-10-CM | POA: Diagnosis not present

## 2016-10-19 DIAGNOSIS — H6120 Impacted cerumen, unspecified ear: Secondary | ICD-10-CM | POA: Diagnosis not present

## 2016-11-27 DIAGNOSIS — N179 Acute kidney failure, unspecified: Secondary | ICD-10-CM | POA: Diagnosis not present

## 2016-11-27 DIAGNOSIS — Z87891 Personal history of nicotine dependence: Secondary | ICD-10-CM | POA: Diagnosis not present

## 2016-11-27 DIAGNOSIS — I4891 Unspecified atrial fibrillation: Secondary | ICD-10-CM | POA: Diagnosis not present

## 2016-11-27 DIAGNOSIS — J440 Chronic obstructive pulmonary disease with acute lower respiratory infection: Secondary | ICD-10-CM | POA: Diagnosis not present

## 2016-11-27 DIAGNOSIS — R531 Weakness: Secondary | ICD-10-CM | POA: Diagnosis not present

## 2016-11-27 DIAGNOSIS — R062 Wheezing: Secondary | ICD-10-CM | POA: Diagnosis not present

## 2016-11-27 DIAGNOSIS — R918 Other nonspecific abnormal finding of lung field: Secondary | ICD-10-CM | POA: Diagnosis not present

## 2016-11-27 DIAGNOSIS — R05 Cough: Secondary | ICD-10-CM | POA: Diagnosis not present

## 2016-11-27 DIAGNOSIS — K219 Gastro-esophageal reflux disease without esophagitis: Secondary | ICD-10-CM | POA: Diagnosis not present

## 2016-11-27 DIAGNOSIS — J181 Lobar pneumonia, unspecified organism: Secondary | ICD-10-CM | POA: Diagnosis not present

## 2016-11-27 DIAGNOSIS — J189 Pneumonia, unspecified organism: Secondary | ICD-10-CM | POA: Diagnosis not present

## 2016-11-27 DIAGNOSIS — J690 Pneumonitis due to inhalation of food and vomit: Secondary | ICD-10-CM | POA: Diagnosis not present

## 2016-11-27 DIAGNOSIS — R5381 Other malaise: Secondary | ICD-10-CM | POA: Diagnosis not present

## 2016-11-27 DIAGNOSIS — E7800 Pure hypercholesterolemia, unspecified: Secondary | ICD-10-CM | POA: Diagnosis not present

## 2016-11-27 DIAGNOSIS — J9601 Acute respiratory failure with hypoxia: Secondary | ICD-10-CM | POA: Diagnosis not present

## 2016-11-27 DIAGNOSIS — R0902 Hypoxemia: Secondary | ICD-10-CM | POA: Diagnosis not present

## 2016-11-27 DIAGNOSIS — I10 Essential (primary) hypertension: Secondary | ICD-10-CM | POA: Diagnosis not present

## 2016-11-27 DIAGNOSIS — Z7901 Long term (current) use of anticoagulants: Secondary | ICD-10-CM | POA: Diagnosis not present

## 2016-11-27 DIAGNOSIS — R0602 Shortness of breath: Secondary | ICD-10-CM | POA: Diagnosis not present

## 2016-11-27 DIAGNOSIS — R509 Fever, unspecified: Secondary | ICD-10-CM | POA: Diagnosis not present

## 2016-11-27 DIAGNOSIS — J441 Chronic obstructive pulmonary disease with (acute) exacerbation: Secondary | ICD-10-CM | POA: Diagnosis not present

## 2016-11-28 DIAGNOSIS — J189 Pneumonia, unspecified organism: Secondary | ICD-10-CM | POA: Diagnosis not present

## 2016-11-28 DIAGNOSIS — I4891 Unspecified atrial fibrillation: Secondary | ICD-10-CM | POA: Diagnosis not present

## 2016-11-28 DIAGNOSIS — J9601 Acute respiratory failure with hypoxia: Secondary | ICD-10-CM | POA: Diagnosis not present

## 2016-11-28 DIAGNOSIS — J441 Chronic obstructive pulmonary disease with (acute) exacerbation: Secondary | ICD-10-CM | POA: Diagnosis not present

## 2016-11-28 DIAGNOSIS — Z7901 Long term (current) use of anticoagulants: Secondary | ICD-10-CM | POA: Diagnosis not present

## 2016-11-30 DIAGNOSIS — M5432 Sciatica, left side: Secondary | ICD-10-CM | POA: Diagnosis not present

## 2016-11-30 DIAGNOSIS — Z5181 Encounter for therapeutic drug level monitoring: Secondary | ICD-10-CM | POA: Diagnosis not present

## 2016-11-30 DIAGNOSIS — I482 Chronic atrial fibrillation: Secondary | ICD-10-CM | POA: Diagnosis not present

## 2016-11-30 DIAGNOSIS — E7800 Pure hypercholesterolemia, unspecified: Secondary | ICD-10-CM | POA: Diagnosis not present

## 2016-11-30 DIAGNOSIS — I110 Hypertensive heart disease with heart failure: Secondary | ICD-10-CM | POA: Diagnosis not present

## 2016-11-30 DIAGNOSIS — Z7901 Long term (current) use of anticoagulants: Secondary | ICD-10-CM | POA: Diagnosis not present

## 2016-11-30 DIAGNOSIS — J181 Lobar pneumonia, unspecified organism: Secondary | ICD-10-CM | POA: Diagnosis not present

## 2016-11-30 DIAGNOSIS — J440 Chronic obstructive pulmonary disease with acute lower respiratory infection: Secondary | ICD-10-CM | POA: Diagnosis not present

## 2016-11-30 DIAGNOSIS — I50812 Chronic right heart failure: Secondary | ICD-10-CM | POA: Diagnosis not present

## 2016-12-01 ENCOUNTER — Encounter: Payer: Self-pay | Admitting: *Deleted

## 2016-12-01 ENCOUNTER — Other Ambulatory Visit: Payer: Self-pay | Admitting: *Deleted

## 2016-12-01 NOTE — Patient Outreach (Signed)
[facility]) Care Management  12/01/2016  Wesley Baker 26-Dec-1925 [id number]  Referral via [place]; recent inpatient admission at [facility] with discharge 11/29/2016.  Per hx review Admission-11/3-11/05/2016 Dx: pneumonia Primary Care Provider-Dr. Maryella Shivers  Telephone call to patient who was advised of reason for call & [facility] care management services. HIPPA verification received from patient.  Patient voices that son lives with him & helps him as needed. States son takes him to all MD appointments. Voices he has hospital follow up appointment scheduled with primary care provider 11/9 -Friday.  States he is taking medications as ordered by his doctor & has only one more day of antibiotic to take for pneumonia. States no problem getting his prescriptions filled. States his condition is better. States he knows to seek medical help if he develops fever or problems with breathing.   States he has home health services and they have been out once already.  Voices he has no health care concerns or needs at this time . Voices that he & son work together & things are okay right now.   Patient consents to receiving [facility] contact information.  Plan: Send [facility] educational literature & Pneumonia information as a review of reportable  signs that he voiced understanding of.  Send to care management assistant to close out.   Sherrin Daisy, RN BSN [place] Management Coordinator [facility] Care Management  [phone]

## 2016-12-02 DIAGNOSIS — M5432 Sciatica, left side: Secondary | ICD-10-CM | POA: Diagnosis not present

## 2016-12-02 DIAGNOSIS — E7800 Pure hypercholesterolemia, unspecified: Secondary | ICD-10-CM | POA: Diagnosis not present

## 2016-12-02 DIAGNOSIS — J440 Chronic obstructive pulmonary disease with acute lower respiratory infection: Secondary | ICD-10-CM | POA: Diagnosis not present

## 2016-12-02 DIAGNOSIS — Z5181 Encounter for therapeutic drug level monitoring: Secondary | ICD-10-CM | POA: Diagnosis not present

## 2016-12-02 DIAGNOSIS — I482 Chronic atrial fibrillation: Secondary | ICD-10-CM | POA: Diagnosis not present

## 2016-12-02 DIAGNOSIS — J181 Lobar pneumonia, unspecified organism: Secondary | ICD-10-CM | POA: Diagnosis not present

## 2016-12-02 DIAGNOSIS — I110 Hypertensive heart disease with heart failure: Secondary | ICD-10-CM | POA: Diagnosis not present

## 2016-12-02 DIAGNOSIS — Z7901 Long term (current) use of anticoagulants: Secondary | ICD-10-CM | POA: Diagnosis not present

## 2016-12-02 DIAGNOSIS — I50812 Chronic right heart failure: Secondary | ICD-10-CM | POA: Diagnosis not present

## 2016-12-03 DIAGNOSIS — J159 Unspecified bacterial pneumonia: Secondary | ICD-10-CM | POA: Diagnosis not present

## 2016-12-03 DIAGNOSIS — I4891 Unspecified atrial fibrillation: Secondary | ICD-10-CM | POA: Diagnosis not present

## 2016-12-03 DIAGNOSIS — Z7901 Long term (current) use of anticoagulants: Secondary | ICD-10-CM | POA: Diagnosis not present

## 2016-12-03 DIAGNOSIS — Z5181 Encounter for therapeutic drug level monitoring: Secondary | ICD-10-CM | POA: Diagnosis not present

## 2016-12-07 DIAGNOSIS — I482 Chronic atrial fibrillation: Secondary | ICD-10-CM | POA: Diagnosis not present

## 2016-12-07 DIAGNOSIS — E7800 Pure hypercholesterolemia, unspecified: Secondary | ICD-10-CM | POA: Diagnosis not present

## 2016-12-07 DIAGNOSIS — I110 Hypertensive heart disease with heart failure: Secondary | ICD-10-CM | POA: Diagnosis not present

## 2016-12-07 DIAGNOSIS — I50812 Chronic right heart failure: Secondary | ICD-10-CM | POA: Diagnosis not present

## 2016-12-07 DIAGNOSIS — Z7901 Long term (current) use of anticoagulants: Secondary | ICD-10-CM | POA: Diagnosis not present

## 2016-12-07 DIAGNOSIS — J181 Lobar pneumonia, unspecified organism: Secondary | ICD-10-CM | POA: Diagnosis not present

## 2016-12-07 DIAGNOSIS — Z5181 Encounter for therapeutic drug level monitoring: Secondary | ICD-10-CM | POA: Diagnosis not present

## 2016-12-07 DIAGNOSIS — M5432 Sciatica, left side: Secondary | ICD-10-CM | POA: Diagnosis not present

## 2016-12-07 DIAGNOSIS — J440 Chronic obstructive pulmonary disease with acute lower respiratory infection: Secondary | ICD-10-CM | POA: Diagnosis not present

## 2016-12-20 DIAGNOSIS — Z5181 Encounter for therapeutic drug level monitoring: Secondary | ICD-10-CM | POA: Diagnosis not present

## 2016-12-20 DIAGNOSIS — I4891 Unspecified atrial fibrillation: Secondary | ICD-10-CM | POA: Diagnosis not present

## 2016-12-20 DIAGNOSIS — J440 Chronic obstructive pulmonary disease with acute lower respiratory infection: Secondary | ICD-10-CM | POA: Diagnosis not present

## 2016-12-20 DIAGNOSIS — Z7901 Long term (current) use of anticoagulants: Secondary | ICD-10-CM | POA: Diagnosis not present

## 2017-02-04 DIAGNOSIS — Z5181 Encounter for therapeutic drug level monitoring: Secondary | ICD-10-CM | POA: Diagnosis not present

## 2017-02-04 DIAGNOSIS — Z6830 Body mass index (BMI) 30.0-30.9, adult: Secondary | ICD-10-CM | POA: Diagnosis not present

## 2017-02-04 DIAGNOSIS — Z7901 Long term (current) use of anticoagulants: Secondary | ICD-10-CM | POA: Diagnosis not present

## 2017-02-04 DIAGNOSIS — I4891 Unspecified atrial fibrillation: Secondary | ICD-10-CM | POA: Diagnosis not present

## 2017-03-07 DIAGNOSIS — I4891 Unspecified atrial fibrillation: Secondary | ICD-10-CM | POA: Diagnosis not present

## 2017-03-07 DIAGNOSIS — Z0000 Encounter for general adult medical examination without abnormal findings: Secondary | ICD-10-CM | POA: Diagnosis not present

## 2017-03-07 DIAGNOSIS — Z1331 Encounter for screening for depression: Secondary | ICD-10-CM | POA: Diagnosis not present

## 2017-03-07 DIAGNOSIS — Z5181 Encounter for therapeutic drug level monitoring: Secondary | ICD-10-CM | POA: Diagnosis not present

## 2017-03-07 DIAGNOSIS — Z7901 Long term (current) use of anticoagulants: Secondary | ICD-10-CM | POA: Diagnosis not present

## 2017-03-07 DIAGNOSIS — Z9181 History of falling: Secondary | ICD-10-CM | POA: Diagnosis not present

## 2017-03-07 DIAGNOSIS — Z139 Encounter for screening, unspecified: Secondary | ICD-10-CM | POA: Diagnosis not present

## 2017-03-09 DIAGNOSIS — Z96651 Presence of right artificial knee joint: Secondary | ICD-10-CM | POA: Diagnosis not present

## 2017-03-09 DIAGNOSIS — S8991XA Unspecified injury of right lower leg, initial encounter: Secondary | ICD-10-CM | POA: Diagnosis not present

## 2017-03-09 DIAGNOSIS — T148XXA Other injury of unspecified body region, initial encounter: Secondary | ICD-10-CM | POA: Diagnosis not present

## 2017-03-09 DIAGNOSIS — S79921A Unspecified injury of right thigh, initial encounter: Secondary | ICD-10-CM | POA: Diagnosis not present

## 2017-03-09 DIAGNOSIS — M79661 Pain in right lower leg: Secondary | ICD-10-CM | POA: Diagnosis not present

## 2017-03-09 DIAGNOSIS — M7989 Other specified soft tissue disorders: Secondary | ICD-10-CM | POA: Diagnosis not present

## 2017-03-09 DIAGNOSIS — M79651 Pain in right thigh: Secondary | ICD-10-CM | POA: Diagnosis not present

## 2017-03-09 DIAGNOSIS — S8001XA Contusion of right knee, initial encounter: Secondary | ICD-10-CM | POA: Diagnosis not present

## 2017-03-09 DIAGNOSIS — S72041A Displaced fracture of base of neck of right femur, initial encounter for closed fracture: Secondary | ICD-10-CM | POA: Diagnosis not present

## 2017-03-09 DIAGNOSIS — S299XXA Unspecified injury of thorax, initial encounter: Secondary | ICD-10-CM | POA: Diagnosis not present

## 2017-03-09 DIAGNOSIS — S72001A Fracture of unspecified part of neck of right femur, initial encounter for closed fracture: Secondary | ICD-10-CM | POA: Diagnosis not present

## 2017-03-09 DIAGNOSIS — M25561 Pain in right knee: Secondary | ICD-10-CM | POA: Diagnosis not present

## 2017-03-10 ENCOUNTER — Inpatient Hospital Stay (HOSPITAL_COMMUNITY): Payer: Medicare HMO | Admitting: Anesthesiology

## 2017-03-10 ENCOUNTER — Encounter (HOSPITAL_COMMUNITY)
Admission: EM | Disposition: A | Discharge status: Skilled Nursing Facility | Payer: Self-pay | Source: Other Acute Inpatient Hospital | Attending: Family Medicine

## 2017-03-10 ENCOUNTER — Inpatient Hospital Stay (HOSPITAL_COMMUNITY)
Admission: EM | Admit: 2017-03-10 | Discharge: 2017-03-14 | DRG: 470 | Disposition: A | Discharge status: Skilled Nursing Facility | Payer: Medicare HMO | Source: Other Acute Inpatient Hospital | Attending: Family Medicine | Admitting: Family Medicine

## 2017-03-10 ENCOUNTER — Encounter (HOSPITAL_COMMUNITY): Payer: Self-pay | Admitting: Internal Medicine

## 2017-03-10 ENCOUNTER — Inpatient Hospital Stay (HOSPITAL_COMMUNITY): Payer: Medicare HMO

## 2017-03-10 DIAGNOSIS — E440 Moderate protein-calorie malnutrition: Secondary | ICD-10-CM | POA: Diagnosis present

## 2017-03-10 DIAGNOSIS — R202 Paresthesia of skin: Secondary | ICD-10-CM | POA: Diagnosis present

## 2017-03-10 DIAGNOSIS — M109 Gout, unspecified: Secondary | ICD-10-CM | POA: Diagnosis present

## 2017-03-10 DIAGNOSIS — R278 Other lack of coordination: Secondary | ICD-10-CM | POA: Diagnosis not present

## 2017-03-10 DIAGNOSIS — G4730 Sleep apnea, unspecified: Secondary | ICD-10-CM | POA: Diagnosis present

## 2017-03-10 DIAGNOSIS — Z6826 Body mass index (BMI) 26.0-26.9, adult: Secondary | ICD-10-CM

## 2017-03-10 DIAGNOSIS — Z7901 Long term (current) use of anticoagulants: Secondary | ICD-10-CM | POA: Diagnosis not present

## 2017-03-10 DIAGNOSIS — M1000 Idiopathic gout, unspecified site: Secondary | ICD-10-CM | POA: Diagnosis not present

## 2017-03-10 DIAGNOSIS — D62 Acute posthemorrhagic anemia: Secondary | ICD-10-CM | POA: Diagnosis not present

## 2017-03-10 DIAGNOSIS — S72001A Fracture of unspecified part of neck of right femur, initial encounter for closed fracture: Secondary | ICD-10-CM | POA: Diagnosis not present

## 2017-03-10 DIAGNOSIS — N183 Chronic kidney disease, stage 3 (moderate): Secondary | ICD-10-CM | POA: Diagnosis not present

## 2017-03-10 DIAGNOSIS — Z96651 Presence of right artificial knee joint: Secondary | ICD-10-CM | POA: Diagnosis present

## 2017-03-10 DIAGNOSIS — Z96641 Presence of right artificial hip joint: Secondary | ICD-10-CM | POA: Diagnosis not present

## 2017-03-10 DIAGNOSIS — Z96649 Presence of unspecified artificial hip joint: Secondary | ICD-10-CM

## 2017-03-10 DIAGNOSIS — I130 Hypertensive heart and chronic kidney disease with heart failure and stage 1 through stage 4 chronic kidney disease, or unspecified chronic kidney disease: Secondary | ICD-10-CM | POA: Diagnosis not present

## 2017-03-10 DIAGNOSIS — R001 Bradycardia, unspecified: Secondary | ICD-10-CM | POA: Diagnosis not present

## 2017-03-10 DIAGNOSIS — J410 Simple chronic bronchitis: Secondary | ICD-10-CM | POA: Diagnosis not present

## 2017-03-10 DIAGNOSIS — Z87891 Personal history of nicotine dependence: Secondary | ICD-10-CM | POA: Diagnosis not present

## 2017-03-10 DIAGNOSIS — J449 Chronic obstructive pulmonary disease, unspecified: Secondary | ICD-10-CM | POA: Diagnosis not present

## 2017-03-10 DIAGNOSIS — Y9301 Activity, walking, marching and hiking: Secondary | ICD-10-CM | POA: Diagnosis not present

## 2017-03-10 DIAGNOSIS — F0390 Unspecified dementia without behavioral disturbance: Secondary | ICD-10-CM | POA: Diagnosis present

## 2017-03-10 DIAGNOSIS — M4800 Spinal stenosis, site unspecified: Secondary | ICD-10-CM | POA: Diagnosis present

## 2017-03-10 DIAGNOSIS — I4821 Permanent atrial fibrillation: Secondary | ICD-10-CM | POA: Diagnosis present

## 2017-03-10 DIAGNOSIS — W1830XA Fall on same level, unspecified, initial encounter: Secondary | ICD-10-CM | POA: Diagnosis present

## 2017-03-10 DIAGNOSIS — M6281 Muscle weakness (generalized): Secondary | ICD-10-CM | POA: Diagnosis not present

## 2017-03-10 DIAGNOSIS — J42 Unspecified chronic bronchitis: Secondary | ICD-10-CM | POA: Diagnosis not present

## 2017-03-10 DIAGNOSIS — M25461 Effusion, right knee: Secondary | ICD-10-CM | POA: Diagnosis present

## 2017-03-10 DIAGNOSIS — I4891 Unspecified atrial fibrillation: Secondary | ICD-10-CM | POA: Diagnosis not present

## 2017-03-10 DIAGNOSIS — I452 Bifascicular block: Secondary | ICD-10-CM | POA: Diagnosis not present

## 2017-03-10 DIAGNOSIS — I509 Heart failure, unspecified: Secondary | ICD-10-CM | POA: Diagnosis present

## 2017-03-10 DIAGNOSIS — R2681 Unsteadiness on feet: Secondary | ICD-10-CM | POA: Diagnosis not present

## 2017-03-10 DIAGNOSIS — S7011XA Contusion of right thigh, initial encounter: Secondary | ICD-10-CM | POA: Diagnosis present

## 2017-03-10 DIAGNOSIS — R2689 Other abnormalities of gait and mobility: Secondary | ICD-10-CM | POA: Diagnosis not present

## 2017-03-10 DIAGNOSIS — Z471 Aftercare following joint replacement surgery: Secondary | ICD-10-CM | POA: Diagnosis not present

## 2017-03-10 DIAGNOSIS — S7291XD Unspecified fracture of right femur, subsequent encounter for closed fracture with routine healing: Secondary | ICD-10-CM | POA: Diagnosis not present

## 2017-03-10 HISTORY — DX: Sleep apnea, unspecified: G47.30

## 2017-03-10 HISTORY — DX: Gout, unspecified: M10.9

## 2017-03-10 HISTORY — PX: HIP ARTHROPLASTY: SHX981

## 2017-03-10 HISTORY — DX: Essential (primary) hypertension: I10

## 2017-03-10 HISTORY — DX: Cardiac arrhythmia, unspecified: I49.9

## 2017-03-10 HISTORY — DX: Chronic obstructive pulmonary disease, unspecified: J44.9

## 2017-03-10 LAB — CBC WITH DIFFERENTIAL/PLATELET
BASOS ABS: 0 10*3/uL (ref 0.0–0.1)
Basophils Relative: 0 %
Eosinophils Absolute: 0 10*3/uL (ref 0.0–0.7)
Eosinophils Relative: 0 %
HCT: 41.6 % (ref 39.0–52.0)
Hemoglobin: 13.8 g/dL (ref 13.0–17.0)
Lymphocytes Relative: 3 %
Lymphs Abs: 0.3 10*3/uL — ABNORMAL LOW (ref 0.7–4.0)
MCH: 30.6 pg (ref 26.0–34.0)
MCHC: 33.2 g/dL (ref 30.0–36.0)
MCV: 92.2 fL (ref 78.0–100.0)
MONO ABS: 0.6 10*3/uL (ref 0.1–1.0)
Monocytes Relative: 6 %
NEUTROS ABS: 9.4 10*3/uL — AB (ref 1.7–7.7)
Neutrophils Relative %: 91 %
Platelets: 196 10*3/uL (ref 150–400)
RBC: 4.51 MIL/uL (ref 4.22–5.81)
RDW: 14.8 % (ref 11.5–15.5)
WBC: 10.3 10*3/uL (ref 4.0–10.5)

## 2017-03-10 LAB — PROTIME-INR
INR: 2.07
INR: 1.81
PROTHROMBIN TIME: 20.8 s — AB (ref 11.4–15.2)
Prothrombin Time: 23.1 seconds — ABNORMAL HIGH (ref 11.4–15.2)

## 2017-03-10 LAB — COMPREHENSIVE METABOLIC PANEL
ALT: 15 U/L — AB (ref 17–63)
AST: 27 U/L (ref 15–41)
Albumin: 3.6 g/dL (ref 3.5–5.0)
Alkaline Phosphatase: 94 U/L (ref 38–126)
Anion gap: 14 (ref 5–15)
BILIRUBIN TOTAL: 2.8 mg/dL — AB (ref 0.3–1.2)
BUN: 24 mg/dL — AB (ref 6–20)
CO2: 29 mmol/L (ref 22–32)
CREATININE: 1.3 mg/dL — AB (ref 0.61–1.24)
Calcium: 8.8 mg/dL — ABNORMAL LOW (ref 8.9–10.3)
Chloride: 96 mmol/L — ABNORMAL LOW (ref 101–111)
GFR calc Af Amer: 54 mL/min — ABNORMAL LOW (ref >60)
GFR, EST NON AFRICAN AMERICAN: 47 mL/min — AB (ref >60)
Glucose, Bld: 137 mg/dL — ABNORMAL HIGH (ref 65–99)
POTASSIUM: 3.9 mmol/L (ref 3.5–5.1)
Sodium: 139 mmol/L (ref 135–145)
TOTAL PROTEIN: 6.6 g/dL (ref 6.5–8.1)

## 2017-03-10 LAB — TYPE AND SCREEN
ABO/RH(D): A POS
Antibody Screen: NEGATIVE

## 2017-03-10 SURGERY — HEMIARTHROPLASTY, HIP, DIRECT ANTERIOR APPROACH, FOR FRACTURE
Anesthesia: General | Site: Hip | Laterality: Right

## 2017-03-10 MED ORDER — PREMIER PROTEIN SHAKE
11.0000 [oz_av] | Freq: Three times a day (TID) | ORAL | Status: DC
  Administered 2017-03-10 – 2017-03-14 (×11): 11 [oz_av] via ORAL
  Filled 2017-03-10 (×19): qty 325.31

## 2017-03-10 MED ORDER — METOPROLOL TARTRATE 5 MG/5ML IV SOLN
5.0000 mg | Freq: Four times a day (QID) | INTRAVENOUS | Status: DC
  Administered 2017-03-10 – 2017-03-11 (×3): 5 mg via INTRAVENOUS
  Filled 2017-03-10 (×3): qty 5

## 2017-03-10 MED ORDER — WARFARIN - PHARMACIST DOSING INPATIENT: Freq: Every day | Status: DC

## 2017-03-10 MED ORDER — ACETAMINOPHEN 325 MG PO TABS
650.0000 mg | ORAL_TABLET | Freq: Four times a day (QID) | ORAL | Status: DC | PRN
  Administered 2017-03-13 – 2017-03-14 (×2): 650 mg via ORAL
  Filled 2017-03-10 (×2): qty 2

## 2017-03-10 MED ORDER — TRANEXAMIC ACID 1000 MG/10ML IV SOLN
1000.0000 mg | INTRAVENOUS | Status: AC
  Administered 2017-03-10: 1000 mg via INTRAVENOUS
  Filled 2017-03-10: qty 10

## 2017-03-10 MED ORDER — HYDROMORPHONE HCL 1 MG/ML IJ SOLN: 0.2500 mg | INTRAMUSCULAR | Status: DC | PRN

## 2017-03-10 MED ORDER — DEXAMETHASONE SODIUM PHOSPHATE 10 MG/ML IJ SOLN
INTRAMUSCULAR | Status: DC | PRN
  Administered 2017-03-10: 4 mg via INTRAVENOUS

## 2017-03-10 MED ORDER — IPRATROPIUM-ALBUTEROL 0.5-2.5 (3) MG/3ML IN SOLN: 3.0000 mL | RESPIRATORY_TRACT | Status: DC | PRN

## 2017-03-10 MED ORDER — TRANEXAMIC ACID 1000 MG/10ML IV SOLN
2000.0000 mg | INTRAVENOUS | Status: AC
  Administered 2017-03-10: 2000 mg via TOPICAL
  Filled 2017-03-10: qty 20

## 2017-03-10 MED ORDER — ACETAMINOPHEN 650 MG RE SUPP: 650.0000 mg | Freq: Four times a day (QID) | RECTAL | Status: DC | PRN

## 2017-03-10 MED ORDER — ADULT MULTIVITAMIN W/MINERALS CH
1.0000 | ORAL_TABLET | Freq: Every day | ORAL | Status: DC
  Administered 2017-03-11 – 2017-03-14 (×4): 1 via ORAL
  Filled 2017-03-10 (×4): qty 1

## 2017-03-10 MED ORDER — MOMETASONE FURO-FORMOTEROL FUM 100-5 MCG/ACT IN AERO
2.0000 | INHALATION_SPRAY | Freq: Two times a day (BID) | RESPIRATORY_TRACT | Status: DC
  Administered 2017-03-10 – 2017-03-14 (×8): 2 via RESPIRATORY_TRACT
  Filled 2017-03-10: qty 8.8

## 2017-03-10 MED ORDER — CHLORHEXIDINE GLUCONATE 4 % EX LIQD: 60.0000 mL | Freq: Once | CUTANEOUS | Status: DC

## 2017-03-10 MED ORDER — LACTATED RINGERS IV SOLN
INTRAVENOUS | Status: DC
  Administered 2017-03-10: 18:00:00 via INTRAVENOUS

## 2017-03-10 MED ORDER — LACTATED RINGERS IV SOLN
INTRAVENOUS | Status: DC | PRN
  Administered 2017-03-10 (×2): via INTRAVENOUS

## 2017-03-10 MED ORDER — LACTATED RINGERS IV SOLN: INTRAVENOUS | Status: DC

## 2017-03-10 MED ORDER — DEXAMETHASONE SODIUM PHOSPHATE 10 MG/ML IJ SOLN
INTRAMUSCULAR | Status: AC
  Filled 2017-03-10: qty 1

## 2017-03-10 MED ORDER — HYDRALAZINE HCL 20 MG/ML IJ SOLN
10.0000 mg | INTRAMUSCULAR | Status: DC | PRN
  Administered 2017-03-10: 10 mg via INTRAVENOUS
  Filled 2017-03-10: qty 1

## 2017-03-10 MED ORDER — PHENYLEPHRINE HCL 10 MG/ML IJ SOLN
INTRAMUSCULAR | Status: DC | PRN
  Administered 2017-03-10: 120 ug via INTRAVENOUS
  Administered 2017-03-10 (×2): 80 ug via INTRAVENOUS

## 2017-03-10 MED ORDER — FENTANYL CITRATE (PF) 100 MCG/2ML IJ SOLN
INTRAMUSCULAR | Status: DC | PRN
  Administered 2017-03-10 (×2): 50 ug via INTRAVENOUS
  Administered 2017-03-10: 25 ug via INTRAVENOUS

## 2017-03-10 MED ORDER — METHOCARBAMOL 1000 MG/10ML IJ SOLN
500.0000 mg | Freq: Four times a day (QID) | INTRAVENOUS | Status: DC | PRN
  Filled 2017-03-10: qty 5

## 2017-03-10 MED ORDER — PHENYLEPHRINE 40 MCG/ML (10ML) SYRINGE FOR IV PUSH (FOR BLOOD PRESSURE SUPPORT)
PREFILLED_SYRINGE | INTRAVENOUS | Status: AC
  Filled 2017-03-10: qty 10

## 2017-03-10 MED ORDER — 0.9 % SODIUM CHLORIDE (POUR BTL) OPTIME
TOPICAL | Status: DC | PRN
  Administered 2017-03-10: 1000 mL

## 2017-03-10 MED ORDER — FENTANYL CITRATE (PF) 100 MCG/2ML IJ SOLN: 25.0000 ug | INTRAMUSCULAR | Status: DC | PRN

## 2017-03-10 MED ORDER — ONDANSETRON HCL 4 MG/2ML IJ SOLN
INTRAMUSCULAR | Status: DC | PRN
  Administered 2017-03-10: 4 mg via INTRAVENOUS

## 2017-03-10 MED ORDER — SUGAMMADEX SODIUM 200 MG/2ML IV SOLN
INTRAVENOUS | Status: DC | PRN
  Administered 2017-03-10: 200 mg via INTRAVENOUS

## 2017-03-10 MED ORDER — BUPIVACAINE HCL 0.25 % IJ SOLN
INTRAMUSCULAR | Status: DC | PRN
  Administered 2017-03-10: 30 mL

## 2017-03-10 MED ORDER — VITAMIN K1 10 MG/ML IJ SOLN
5.0000 mg | Freq: Once | INTRAVENOUS | Status: AC
  Administered 2017-03-10: 5 mg via INTRAVENOUS
  Filled 2017-03-10: qty 0.5

## 2017-03-10 MED ORDER — IPRATROPIUM-ALBUTEROL 0.5-2.5 (3) MG/3ML IN SOLN
3.0000 mL | Freq: Two times a day (BID) | RESPIRATORY_TRACT | Status: AC
  Administered 2017-03-10: 3 mL via RESPIRATORY_TRACT
  Filled 2017-03-10: qty 3

## 2017-03-10 MED ORDER — FENTANYL CITRATE (PF) 250 MCG/5ML IJ SOLN
INTRAMUSCULAR | Status: AC
  Filled 2017-03-10: qty 5

## 2017-03-10 MED ORDER — CEFAZOLIN SODIUM-DEXTROSE 2-4 GM/100ML-% IV SOLN
2.0000 g | Freq: Four times a day (QID) | INTRAVENOUS | Status: AC
  Administered 2017-03-10 – 2017-03-11 (×2): 2 g via INTRAVENOUS
  Filled 2017-03-10 (×2): qty 100

## 2017-03-10 MED ORDER — SUGAMMADEX SODIUM 200 MG/2ML IV SOLN
INTRAVENOUS | Status: AC
  Filled 2017-03-10: qty 2

## 2017-03-10 MED ORDER — ACETAMINOPHEN 500 MG PO TABS: 1000.0000 mg | ORAL_TABLET | Freq: Once | ORAL | Status: DC

## 2017-03-10 MED ORDER — HYDROCODONE-ACETAMINOPHEN 5-325 MG PO TABS: 1.0000 | ORAL_TABLET | Freq: Four times a day (QID) | ORAL | 0 refills | Status: DC | PRN

## 2017-03-10 MED ORDER — ROCURONIUM BROMIDE 10 MG/ML (PF) SYRINGE
PREFILLED_SYRINGE | INTRAVENOUS | Status: AC
  Filled 2017-03-10: qty 10

## 2017-03-10 MED ORDER — PROPOFOL 10 MG/ML IV BOLUS
INTRAVENOUS | Status: DC | PRN
  Administered 2017-03-10: 100 mg via INTRAVENOUS

## 2017-03-10 MED ORDER — LACTATED RINGERS IV SOLN
INTRAVENOUS | Status: DC
  Administered 2017-03-10: 14:00:00 via INTRAVENOUS

## 2017-03-10 MED ORDER — ONDANSETRON HCL 4 MG/2ML IJ SOLN
INTRAMUSCULAR | Status: AC
  Filled 2017-03-10: qty 2

## 2017-03-10 MED ORDER — ROCURONIUM BROMIDE 100 MG/10ML IV SOLN
INTRAVENOUS | Status: DC | PRN
  Administered 2017-03-10: 50 mg via INTRAVENOUS

## 2017-03-10 MED ORDER — HYDROCODONE-ACETAMINOPHEN 5-325 MG PO TABS
1.0000 | ORAL_TABLET | Freq: Four times a day (QID) | ORAL | Status: DC | PRN
  Administered 2017-03-10 – 2017-03-11 (×2): 1 via ORAL
  Administered 2017-03-11: 2 via ORAL
  Administered 2017-03-11: 1 via ORAL
  Administered 2017-03-12: 2 via ORAL
  Administered 2017-03-12 – 2017-03-13 (×3): 1 via ORAL
  Filled 2017-03-10 (×3): qty 1
  Filled 2017-03-10: qty 2
  Filled 2017-03-10 (×3): qty 1
  Filled 2017-03-10: qty 2
  Filled 2017-03-10: qty 1

## 2017-03-10 MED ORDER — BUPIVACAINE HCL (PF) 0.25 % IJ SOLN
INTRAMUSCULAR | Status: AC
  Filled 2017-03-10: qty 30

## 2017-03-10 MED ORDER — METHOCARBAMOL 500 MG PO TABS
500.0000 mg | ORAL_TABLET | Freq: Four times a day (QID) | ORAL | Status: DC | PRN
  Administered 2017-03-10 – 2017-03-14 (×4): 500 mg via ORAL
  Filled 2017-03-10 (×4): qty 1

## 2017-03-10 MED ORDER — CEFAZOLIN SODIUM-DEXTROSE 2-4 GM/100ML-% IV SOLN
2.0000 g | INTRAVENOUS | Status: AC
  Administered 2017-03-10: 2 g via INTRAVENOUS
  Filled 2017-03-10: qty 100

## 2017-03-10 MED ORDER — LIDOCAINE 2% (20 MG/ML) 5 ML SYRINGE
INTRAMUSCULAR | Status: AC
  Filled 2017-03-10: qty 5

## 2017-03-10 MED ORDER — SODIUM CHLORIDE 0.9% FLUSH
INTRAVENOUS | Status: DC | PRN
  Administered 2017-03-10: 30 mL

## 2017-03-10 MED ORDER — LIDOCAINE HCL (CARDIAC) 20 MG/ML IV SOLN
INTRAVENOUS | Status: DC | PRN
  Administered 2017-03-10: 60 mg via INTRAVENOUS

## 2017-03-10 MED ORDER — ONDANSETRON HCL 4 MG/2ML IJ SOLN: 4.0000 mg | Freq: Once | INTRAMUSCULAR | Status: DC | PRN

## 2017-03-10 MED ORDER — MORPHINE SULFATE (PF) 2 MG/ML IV SOLN
0.5000 mg | INTRAVENOUS | Status: DC | PRN
  Administered 2017-03-10: 0.5 mg via INTRAVENOUS
  Filled 2017-03-10: qty 1

## 2017-03-10 MED ORDER — EPHEDRINE 5 MG/ML INJ
INTRAVENOUS | Status: AC
  Filled 2017-03-10: qty 10

## 2017-03-10 MED ORDER — PROPOFOL 10 MG/ML IV BOLUS
INTRAVENOUS | Status: AC
  Filled 2017-03-10: qty 20

## 2017-03-10 MED ORDER — POVIDONE-IODINE 10 % EX SWAB: 2.0000 "application " | Freq: Once | CUTANEOUS | Status: DC

## 2017-03-10 MED ORDER — WARFARIN SODIUM 4 MG PO TABS
4.0000 mg | ORAL_TABLET | Freq: Once | ORAL | Status: AC
  Administered 2017-03-10: 4 mg via ORAL
  Filled 2017-03-10: qty 1

## 2017-03-10 SURGICAL SUPPLY — 49 items
BIT DRILL 7/64X5 DISP (BIT) ×3 IMPLANT
BLADE SAGITTAL 25.0X1.27X90 (BLADE) ×2 IMPLANT
BLADE SAGITTAL 25.0X1.27X90MM (BLADE) ×1
CAPT HIP HEMI 2B ×3 IMPLANT
CLOSURE STERI-STRIP 1/2X4 (GAUZE/BANDAGES/DRESSINGS) ×1
CLOSURE WOUND 1/2 X4 (GAUZE/BANDAGES/DRESSINGS) ×1
CLSR STERI-STRIP ANTIMIC 1/2X4 (GAUZE/BANDAGES/DRESSINGS) ×3 IMPLANT
COVER SURGICAL LIGHT HANDLE (MISCELLANEOUS) ×3 IMPLANT
DRAPE ORTHO SPLIT 77X108 STRL (DRAPES) ×6
DRAPE SURG ORHT 6 SPLT 77X108 (DRAPES) ×2 IMPLANT
DRAPE U-SHAPE 47X51 STRL (DRAPES) ×3 IMPLANT
DRSG MEPILEX BORDER 4X12 (GAUZE/BANDAGES/DRESSINGS) ×2 IMPLANT
DRSG MEPILEX BORDER 4X8 (GAUZE/BANDAGES/DRESSINGS) ×1 IMPLANT
DURAPREP 26ML APPLICATOR (WOUND CARE) ×3 IMPLANT
ELECT BLADE 4.0 EZ CLEAN MEGAD (MISCELLANEOUS) ×3
ELECT CAUTERY BLADE 6.4 (BLADE) ×3 IMPLANT
ELECT REM PT RETURN 9FT ADLT (ELECTROSURGICAL) ×3
ELECTRODE BLDE 4.0 EZ CLN MEGD (MISCELLANEOUS) ×1 IMPLANT
ELECTRODE REM PT RTRN 9FT ADLT (ELECTROSURGICAL) ×1 IMPLANT
FACESHIELD WRAPAROUND (MASK) IMPLANT
FACESHIELD WRAPAROUND OR TEAM (MASK) ×1 IMPLANT
GLOVE BIO SURGEON STRL SZ7.5 (GLOVE) ×6 IMPLANT
GLOVE BIOGEL PI IND STRL 8 (GLOVE) ×2 IMPLANT
GLOVE BIOGEL PI INDICATOR 8 (GLOVE) ×4
GOWN STRL REUS W/ TWL LRG LVL3 (GOWN DISPOSABLE) ×2 IMPLANT
GOWN STRL REUS W/ TWL XL LVL3 (GOWN DISPOSABLE) ×1 IMPLANT
GOWN STRL REUS W/TWL LRG LVL3 (GOWN DISPOSABLE) ×6
GOWN STRL REUS W/TWL XL LVL3 (GOWN DISPOSABLE) ×3
HIP CAPITATED HEMI 2B IMPLANT
KIT BASIN OR (CUSTOM PROCEDURE TRAY) ×3 IMPLANT
KIT ROOM TURNOVER OR (KITS) ×3 IMPLANT
MANIFOLD NEPTUNE II (INSTRUMENTS) ×3 IMPLANT
NS IRRIG 1000ML POUR BTL (IV SOLUTION) ×3 IMPLANT
PACK TOTAL JOINT (CUSTOM PROCEDURE TRAY) ×3 IMPLANT
PAD ARMBOARD 7.5X6 YLW CONV (MISCELLANEOUS) ×6 IMPLANT
PILLOW ABDUCTION HIP (SOFTGOODS) ×3 IMPLANT
RETRIEVER SUT HEWSON (MISCELLANEOUS) ×3 IMPLANT
SLEEVE UNITRAX (Orthopedic Implant) IMPLANT
STRIP CLOSURE SKIN 1/2X4 (GAUZE/BANDAGES/DRESSINGS) ×1 IMPLANT
SUT FIBERWIRE #2 38 REV NDL BL (SUTURE) ×6
SUT MNCRL AB 4-0 PS2 18 (SUTURE) ×3 IMPLANT
SUT MON AB 2-0 CT1 36 (SUTURE) ×3 IMPLANT
SUT VIC AB 1 CT1 27 (SUTURE) ×9
SUT VIC AB 1 CT1 27XBRD ANBCTR (SUTURE) ×1 IMPLANT
SUT VLOC 180 0 24IN GS25 (SUTURE) ×2 IMPLANT
SUTURE FIBERWR#2 38 REV NDL BL (SUTURE) ×2 IMPLANT
TOWEL OR 17X24 6PK STRL BLUE (TOWEL DISPOSABLE) ×3 IMPLANT
TOWEL OR 17X26 10 PK STRL BLUE (TOWEL DISPOSABLE) ×3 IMPLANT
TRAY FOLEY CATH SILVER 14FR (SET/KITS/TRAYS/PACK) IMPLANT

## 2017-03-10 NOTE — Consult Note (Signed)
Reason for Consult:Right hip fracture Referring Physician: [facility] Dr. [name] is an 84 y.o. male.  HPI: Wesley Baker is a 84 y.o. male with history of atrial fibrillation, possible CHF, COPD, hypertension, gout, chronic kidney disease, chronic anemia was brought to the ER at [facility] [address] after patient had a mechanical fall.  Patient states he was walking when he tripped over the corner of a rug.  Denies hitting his head or losing consciousness.  Patient was taken to [facility] and x-rays revealed right hip fracture.   There also was concern for patient's right knee which is swollen apparantly x-rays do not show any definite fractures.  X-rays of right ankle show some cortical irregularity.  Chest x-ray was unremarkable EKG showing A. fib rate controlled.  ER physician at [facility] discussed with Dr. French Ana orthopedic surgeon at [facility] and patient is being transferred for further management.  Right knee replacement by Dr. Ronnie Derby years ago had not had any problem until recent fall.  Regarding his a-fib history from son at bedside he states is managed by PCP was on coumadin and they had recently decided to discontinue for a day then start on eliquis in place of.  Patient last dose of coumadin was Tuesday 2/12, injury occurring Wed 2/13.  Last thing by mouth some water and apple juice around 0400.      Past Medical History:  Diagnosis Date  . COPD (chronic obstructive pulmonary disease) ([place])   . Dysrhythmia   . Gout   . Hypertension   . Sleep apnea     Past Surgical History:  Procedure Laterality Date  . BACK SURGERY    . KNEE SURGERY    . PROSTATE SURGERY      Family History  Problem Relation Age of Onset  . Diabetes Mellitus II Son     Social History:  reports that he has quit smoking. he has never used smokeless tobacco. He reports that he does not drink alcohol or use drugs.  Allergies: Not on File  Medications: I  have reviewed the patient's current medications.  Results for orders placed or performed during the hospital encounter of 03/10/17 (from the past 48 hour(s))  CBC WITH DIFFERENTIAL     Status: Abnormal   Collection Time: 03/10/17  4:12 AM  Result Value Ref Range   WBC 10.3 4.0 - 10.5 K/uL   RBC 4.51 4.22 - 5.81 MIL/uL   Hemoglobin 13.8 13.0 - 17.0 g/dL   HCT 41.6 39.0 - 52.0 %   MCV 92.2 78.0 - 100.0 fL   MCH 30.6 26.0 - 34.0 pg   MCHC 33.2 30.0 - 36.0 g/dL   RDW 14.8 11.5 - 15.5 %   Platelets 196 150 - 400 K/uL   Neutrophils Relative % 91 %   Neutro Abs 9.4 (H) 1.7 - 7.7 K/uL   Lymphocytes Relative 3 %   Lymphs Abs 0.3 (L) 0.7 - 4.0 K/uL   Monocytes Relative 6 %   Monocytes Absolute 0.6 0.1 - 1.0 K/uL   Eosinophils Relative 0 %   Eosinophils Absolute 0.0 0.0 - 0.7 K/uL   Basophils Relative 0 %   Basophils Absolute 0.0 0.0 - 0.1 K/uL    Comment: Performed at [facility], [address], [place], [place] [address]  Comprehensive metabolic panel     Status: Abnormal   Collection Time: 03/10/17  4:12 AM  Result Value Ref Range   Sodium 139 135 -  145 mmol/L   Potassium 3.9 3.5 - 5.1 mmol/L   Chloride 96 (L) 101 - 111 mmol/L   CO2 29 22 - 32 mmol/L   Glucose, Bld 137 (H) 65 - 99 mg/dL   BUN 24 (H) 6 - 20 mg/dL   Creatinine, Ser 1.30 (H) 0.61 - 1.24 mg/dL   Calcium 8.8 (L) 8.9 - 10.3 mg/dL   Total Protein 6.6 6.5 - 8.1 g/dL   Albumin 3.6 3.5 - 5.0 g/dL   AST 27 15 - 41 U/L   ALT 15 (L) 17 - 63 U/L   Alkaline Phosphatase 94 38 - 126 U/L   Total Bilirubin 2.8 (H) 0.3 - 1.2 mg/dL   GFR calc non Af Amer 47 (L) >60 mL/min   GFR calc Af Amer 54 (L) >60 mL/min    Comment: (NOTE) The eGFR has been calculated using the CKD EPI equation. This calculation has not been validated in all clinical situations. eGFR's persistently <60 mL/min signify possible Chronic Kidney Disease.    Anion gap 14 5 - 15    Comment: Performed at [place] [address],  [place], [place] [address]  Type and screen [place]     Status: None   Collection Time: 03/10/17  4:12 AM  Result Value Ref Range   ABO/RH(D) A POS    Antibody Screen NEG    Sample Expiration      03/13/2017 Performed at [facility], [place] [address], [place], [place] [address]   Protime-INR     Status: Abnormal   Collection Time: 03/10/17  4:12 AM  Result Value Ref Range   Prothrombin Time 23.1 (H) 11.4 - 15.2 seconds   INR 2.07     Comment: Performed at [facility], [place] [address], [place], [place] [address]    No results found.  Review of Systems  Constitutional: Negative for fever.  Respiratory: Negative for shortness of breath.   Cardiovascular: Positive for leg swelling. Negative for chest pain.  Gastrointestinal: Negative for abdominal pain, nausea and vomiting.  Genitourinary: Negative for dysuria.  Musculoskeletal: Positive for back pain, falls and joint pain.  Neurological: Negative for dizziness.   Blood pressure (!) 124/45, pulse 91, temperature (!) 97 F (36.1 C), temperature source Oral, SpO2 95 %. Physical Exam  Constitutional: He is oriented to person, place, and time. He appears well-developed. No distress.  HENT:  Head: Normocephalic and atraumatic.  Eyes: Conjunctivae and EOM are normal. Pupils are equal, round, and reactive to light.  Neck: Normal range of motion. Neck supple.  Cardiovascular: Normal rate and intact distal pulses. An irregular rhythm present.  Respiratory: Effort normal. No respiratory distress.  GI: Soft. He exhibits no distension. There is no tenderness.  Musculoskeletal:  Examination RLE NVI, no calf TTP, intact DF/PF ankle without pain, no point tenderness over the ankle, Right knee with effusion previous scar from TKA no gross ligamentous instability with varus/valgus stress but difficult to get full exam secondary to hip pain, Right hip TTP, pain with any motion passively.  Lymphadenopathy:    He has no  cervical adenopathy.  Neurological: He is alert and oriented to person, place, and time.  Skin: Skin is warm and dry. No rash noted. No erythema.  Psychiatric: He has a normal mood and affect. His behavior is normal.    Assessment/Plan: Right hip fracture   Plan will be for surgical fixation of the hip hopefully today.  Appears intermediate risk  per hospitalist note.  Remain NPO.  Need to access [facility] for further review.  Pain control as ordered.  Will likely start on Eliquis post op for afib management and dvt proph.      Chriss Czar 03/10/2017, 8:58 AM

## 2017-03-10 NOTE — Anesthesia Preprocedure Evaluation (Addendum)
Anesthesia Evaluation  Patient identified by MRN, date of birth, ID band Patient awake    Reviewed: Allergy & Precautions, NPO status , Patient's Chart, lab work & pertinent test results, reviewed documented beta blocker date and time   Airway Mallampati: II  TM Distance: >3 FB Neck ROM: Full    Dental  (+) Edentulous Lower, Edentulous Upper   Pulmonary sleep apnea , COPD,  COPD inhaler, former smoker,    Pulmonary exam normal breath sounds clear to auscultation       Cardiovascular hypertension, Pt. on home beta blockers Normal cardiovascular exam+ dysrhythmias Atrial Fibrillation  Rhythm:Irregular Rate:Normal  ECG: A-fib, rate 83. RBBB, LAFB   Neuro/Psych negative neurological ROS  negative psych ROS   GI/Hepatic negative GI ROS, Neg liver ROS,   Endo/Other  negative endocrine ROS  Renal/GU negative Renal ROS     Musculoskeletal Gout   Abdominal   Peds  Hematology negative hematology ROS (+)   Anesthesia Other Findings RIGHT NECK FRACRTURE HIP  Reproductive/Obstetrics                           Anesthesia Physical Anesthesia Plan  ASA: III  Anesthesia Plan: General   Post-op Pain Management:    Induction: Intravenous  PONV Risk Score and Plan: 2 and Ondansetron, Dexamethasone and Treatment may vary due to age or medical condition  Airway Management Planned: Oral ETT  Additional Equipment: None  Intra-op Plan:   Post-operative Plan: Extubation in OR  Informed Consent: I have reviewed the patients History and Physical, chart, labs and discussed the procedure including the risks, benefits and alternatives for the proposed anesthesia with the patient or authorized representative who has indicated his/her understanding and acceptance.   Dental advisory given  Plan Discussed with: CRNA  Anesthesia Plan Comments:       Anesthesia Quick Evaluation

## 2017-03-10 NOTE — Op Note (Signed)
03/10/2017  3:57 PM  PATIENT:  Wesley Baker   MRN: [id number]  PRE-OPERATIVE DIAGNOSIS:  RIGHT HIP  FRACTURE   POST-OPERATIVE DIAGNOSIS:  RIGHT HIP FRACTURE  PROCEDURE:  Procedure(s): ARTHROPLASTY BIPOLAR HIP (HEMIARTHROPLASTY)  PREOPERATIVE INDICATIONS:  Wesley Baker is an 84 y.o. male who was admitted 03/10/2017 with a diagnosis of Closed right hip fracture, initial encounter [facility]) and elected for surgical management.  The risks benefits and alternatives were discussed with the patient including but not limited to the risks of nonoperative treatment, versus surgical intervention including infection, bleeding, nerve injury, periprosthetic fracture, the need for revision surgery, dislocation, leg length discrepancy, blood clots, cardiopulmonary complications, morbidity, mortality, among others, and they were willing to proceed.  Predicted outcome is good, although there will be at least a six to nine month expected recovery.   OPERATIVE REPORT     SURGEON:  Edmonia Lynch, MD    ASSISTANT:  Roxan Hockey, PA-C, he was present and scrubbed throughout the case, critical for completion in a timely fashion, and for retraction, instrumentation, and closure.      ANESTHESIA:  General    COMPLICATIONS:  None.      COMPONENTS:  [employer] Acolade: Femoral stem: 6, Femoral Head:52, Neck:0   PROCEDURE IN DETAIL: The patient was met in the holding area and identified.  The appropriate hip  was marked at the operative site. The patient was then transported to the OR and  placed under general anesthesia.  At that point, the patient was  placed in the lateral decubitus position with the operative side up and  secured to the operating room table and all bony prominences padded.     The operative lower extremity was prepped from the iliac crest to the toes.  Sterile draping was performed.  Time out was performed prior to incision.      A routine posterolateral approach was utilized via sharp  dissection  carried down to the subcutaneous tissue.  Gross bleeders were Bovie  coagulated.  The iliotibial band was identified and incised  along the length of the skin incision.  Self-retaining retractors were  inserted. I examined the bursa there was significant hematoma and edema I performed a bursectomy here.  With the hip internally rotated, the short external rotators  were identified. The piriformis was tagged with FiberWire, and the hip capsule released in a T-type fashion.  The femoral neck was exposed, and I resected the femoral neck using the appropriate jig. This was performed at approximately a thumb's breadth above the lesser trochanter.    I then exposed the deep acetabulum, cleared out any tissue including the ligamentum teres, and included the hip capsule in the FiberWire used above and below the T.    I then prepared the proximal femur using the cookie-cutter, the lateralizing reamer, and then sequentially broached.  A trial utilized, and I reduced the hip and it was found to have excellent stability with functional range of motion. The trial components were then removed.   The canal and acetabulum were thoroughly irrigated  I inserted the pressfit stem and placed the head and neck collar. The hip was reduced with appropriate force and was stable through a range of motion.   I then used a 2 mm drill bits to pass the FiberWire suture from the capsule and puriform is through the greater trochanter, and secured this. Excellent posterior capsular repair was achieved. I also closed the T in the capsule.  I then irrigated the hip  copiously again with pulse lavage, and repaired the fascia with Vicryl, followed by Vicryl for the subcutaneous tissue, Monocryl for the skin, Steri-Strips and sterile gauze. The wounds were injected. The patient was then awakened and returned to PACU in stable and satisfactory condition. There were no complications.  POST-OP PLAN: Weight bearing as  tolerated. DVT px will consist of SCD's and chemical px  Edmonia Lynch, MD Orthopedic Surgeon [phone]   03/10/2017 3:57 PM

## 2017-03-10 NOTE — Anesthesia Procedure Notes (Signed)
Procedure Name: Intubation Date/Time: 03/10/2017 3:08 PM Performed by: Inda Coke, CRNA Pre-anesthesia Checklist: Patient identified, Emergency Drugs available, Suction available and Patient being monitored Patient Re-evaluated:Patient Re-evaluated prior to induction Oxygen Delivery Method: Circle System Utilized Preoxygenation: Pre-oxygenation with 100% oxygen Induction Type: IV induction Ventilation: Mask ventilation without difficulty Laryngoscope Size: Mac and 4 Grade View: Grade I Tube type: Oral Tube size: 7.5 mm Number of attempts: 1 Airway Equipment and Method: Stylet and Oral airway Placement Confirmation: ETT inserted through vocal cords under direct vision,  positive ETCO2 and breath sounds checked- equal and bilateral Secured at: 21 cm Tube secured with: Tape Dental Injury: Teeth and Oropharynx as per pre-operative assessment

## 2017-03-10 NOTE — H&P (Signed)
Reason for Consult:Right hip fracture Referring Physician: [facility] Dr. Lesli Baker is an 84 y.o. male.  HPI: Wesley Baker a 84 y.o.malewithhistory of atrial fibrillation, possible CHF, COPD, hypertension, gout, chronic kidney disease, chronic anemia was brought to the ER at [facility] after patient had a mechanical fall. Patient states he was walking when he tripped over the corner of a rug. Denies hitting his head or losing consciousness. Patient was taken to [facility] and x-rays revealed right hip fracture.  There also was concern for patient's right knee which is swollen apparantly x-rays do not show any definite fractures. X-rays of right ankle show some cortical irregularity. Chest x-ray was unremarkable EKG showing A. fib rate controlled. ER physician at [facility] discussed with Wesley Baker orthopedic surgeon at [facility] and patient is being transferred for further management.  Right knee replacement by Dr. Ronnie Baker years ago had not had any problem until recent fall.  Regarding his a-fib history from son at bedside he states is managed by PCP was on coumadin and they had recently decided to discontinue for a day then start on eliquis in place of.  Patient last dose of coumadin was Tuesday 2/12, injury occurring Wed 2/13.  Last thing by mouth some water and apple juice around 0400.          Past Medical History:  Diagnosis Date  . COPD (chronic obstructive pulmonary disease) ([place])   . Dysrhythmia   . Gout   . Hypertension   . Sleep apnea          Past Surgical History:  Procedure Laterality Date  . BACK SURGERY    . KNEE SURGERY    . PROSTATE SURGERY           Family History  Problem Relation Age of Onset  . Diabetes Mellitus II Son     Social History:  reports that he has quit smoking. he has never used smokeless tobacco. He reports that he does not drink alcohol or use  drugs.  Allergies: Not on File  Medications:  LabResultsLast48Hours        Results for orders placed or performed during the hospital encounter of 03/10/17 (from the past 48 hour(s))  CBC WITH DIFFERENTIAL     Status: Abnormal   Collection Time: 03/10/17  4:12 AM  Result Value Ref Range   WBC 10.3 4.0 - 10.5 K/uL   RBC 4.51 4.22 - 5.81 MIL/uL   Hemoglobin 13.8 13.0 - 17.0 g/dL   HCT 41.6 39.0 - 52.0 %   MCV 92.2 78.0 - 100.0 fL   MCH 30.6 26.0 - 34.0 pg   MCHC 33.2 30.0 - 36.0 g/dL   RDW 14.8 11.5 - 15.5 %   Platelets 196 150 - 400 K/uL   Neutrophils Relative % 91 %   Neutro Abs 9.4 (H) 1.7 - 7.7 K/uL   Lymphocytes Relative 3 %   Lymphs Abs 0.3 (L) 0.7 - 4.0 K/uL   Monocytes Relative 6 %   Monocytes Absolute 0.6 0.1 - 1.0 K/uL   Eosinophils Relative 0 %   Eosinophils Absolute 0.0 0.0 - 0.7 K/uL   Basophils Relative 0 %   Basophils Absolute 0.0 0.0 - 0.1 K/uL    Comment: Performed at [facility], [address] [place], [place] [address]  Comprehensive metabolic panel     Status: Abnormal   Collection Time: 03/10/17  4:12 AM  Result Value Ref Range  Sodium 139 135 - 145 mmol/L   Potassium 3.9 3.5 - 5.1 mmol/L   Chloride 96 (L) 101 - 111 mmol/L   CO2 29 22 - 32 mmol/L   Glucose, Bld 137 (H) 65 - 99 mg/dL   BUN 24 (H) 6 - 20 mg/dL   Creatinine, Ser 1.30 (H) 0.61 - 1.24 mg/dL   Calcium 8.8 (L) 8.9 - 10.3 mg/dL   Total Protein 6.6 6.5 - 8.1 g/dL   Albumin 3.6 3.5 - 5.0 g/dL   AST 27 15 - 41 U/L   ALT 15 (L) 17 - 63 U/L   Alkaline Phosphatase 94 38 - 126 U/L   Total Bilirubin 2.8 (H) 0.3 - 1.2 mg/dL   GFR calc non Af Amer 47 (L) >60 mL/min   GFR calc Af Amer 54 (L) >60 mL/min    Comment: (NOTE) The eGFR has been calculated using the CKD EPI equation. This calculation has not been validated in all clinical situations. eGFR's persistently <60 mL/min signify possible Chronic Kidney Disease.    Anion gap 14 5 -  15    Comment: Performed at [place] [address], [place], [place] [address]  Type and screen [place]     Status: None   Collection Time: 03/10/17  4:12 AM  Result Value Ref Range   ABO/RH(D) A POS    Antibody Screen NEG    Sample Expiration      03/13/2017 Performed at [facility], [place] [address], [place], [place] [address]   Protime-INR     Status: Abnormal   Collection Time: 03/10/17  4:12 AM  Result Value Ref Range   Prothrombin Time 23.1 (H) 11.4 - 15.2 seconds   INR 2.07     Comment: Performed at [facility], [place] [address], [place], [place] [address]      ImagingResults(Last48hours)  No results found.    Review of Systems  Constitutional: Negative for fever.  Respiratory: Negative for shortness of breath.   Cardiovascular: Positive for leg swelling. Negative for chest pain.  Gastrointestinal: Negative for abdominal pain, nausea and vomiting.  Genitourinary: Negative for dysuria.  Musculoskeletal: Positive for back pain, falls and joint pain.  Neurological: Negative for dizziness.   Blood pressure (!) 124/45, pulse 91, temperature (!) 97 F (36.1 C), temperature source Oral, SpO2 95 %. Physical Exam  Constitutional: He is oriented to person, place, and time. He appears well-developed. No distress.  HENT:  Head: Normocephalic and atraumatic.  Eyes: Conjunctivae and EOM are normal. Pupils are equal, round, and reactive to light.  Neck: Normal range of motion. Neck supple.  Cardiovascular: Normal rate and intact distal pulses. An irregular rhythm present.  Respiratory: Effort normal. No respiratory distress.  GI: Soft. He exhibits no distension. There is no tenderness.  Musculoskeletal:  Examination RLE NVI, no calf TTP, intact DF/PF ankle without pain, no point tenderness over the ankle, Right knee with effusion previous scar from TKA no gross ligamentous instability with varus/valgus stress  but difficult to get full exam secondary to hip pain, Right hip TTP, pain with any motion passively.  Lymphadenopathy:    He has no cervical adenopathy.  Neurological: He is alert and oriented to person, place, and time.  Skin: Skin is warm and dry. No rash noted. No erythema.  Psychiatric: He has a normal mood and affect. His behavior is normal.    Assessment/Plan: Right hip fracture   Plan will be for surgical  fixation of the hip hopefully today.  Appears intermediate risk per hospitalist note.  Remain NPO.  Need to access [facility] for further review.  Pain control as ordered.  Will likely start on Eliquis post op for afib management and dvt proph.       OR today

## 2017-03-10 NOTE — Transfer of Care (Signed)
Immediate Anesthesia Transfer of Care Note  Patient: Wesley Baker  Procedure(s) Performed: ARTHROPLASTY BIPOLAR HIP (HEMIARTHROPLASTY) (Right Hip)  Patient Location: PACU  Anesthesia Type:General  Level of Consciousness: awake and alert   Airway & Oxygen Therapy: Patient Spontanous Breathing and Patient connected to nasal cannula oxygen  Post-op Assessment: Report given to RN, Post -op Vital signs reviewed and stable and Patient moving all extremities X 4  Post vital signs: Reviewed and stable  Last Vitals:  Vitals:   03/10/17 0757 03/10/17 1137  BP:  (!) 136/57  Pulse:  (!) 58  Temp:  36.7 C  SpO2: 95% 100%    Last Pain:  Vitals:   03/10/17 1137  TempSrc: Oral  PainSc:       Patients Stated Pain Goal: 0 (55/01/58 6825)  Complications: No apparent anesthesia complications

## 2017-03-10 NOTE — Progress Notes (Signed)
Patient seen and examined with the family, son by the bedside  84 year old male with a history of atrial fibrillation on Coumadin, congestive heart failure, COPD, hypertension, chronic kidney disease, but no recent history of exacerbation of CHF, atrial fibrillation, who sustained a closed right hip fracture after a mechanical fall. Patient is on Coumadin and he received vitamin K in the ED.  He appears stable from a cardiopulmonary standpoint without any obvious contraindications for surgery Dr. Edmonia Lynch plans on operating on him today, he was notified about his INR of 2 this morning

## 2017-03-10 NOTE — Progress Notes (Signed)
ANTICOAGULATION CONSULT NOTE - Initial Consult  Pharmacy Consult for Coumadin Indication: atrial fibrillation  No Known Allergies  Patient Measurements: Height: 5\' 10"  (177.8 cm) Weight: 182 lb (82.6 kg) IBW/kg (Calculated) : 73  Vital Signs: Temp: 98 F (36.7 C) (02/14 1730) Temp Source: Oral (02/14 1137) BP: 126/64 (02/14 1717) Pulse Rate: 68 (02/14 1717)  Labs: Recent Labs    03/10/17 0412 03/10/17 1253  HGB 13.8  --   HCT 41.6  --   PLT 196  --   LABPROT 23.1* 20.8*  INR 2.07 1.81  CREATININE 1.30*  --     Estimated Creatinine Clearance: 39.8 mL/min (A) (by C-G formula based on SCr of 1.3 mg/dL (H)).   Medical History: Past Medical History:  Diagnosis Date  . COPD (chronic obstructive pulmonary disease) ([place])   . Dysrhythmia   . Gout   . Hypertension   . Sleep apnea     Medications:  Medications Prior to Admission  Medication Sig Dispense Refill Last Dose  . allopurinol (ZYLOPRIM) 300 MG tablet Take 300 mg by mouth daily.   03/08/2017  . budesonide-formoterol (SYMBICORT) 160-4.5 MCG/ACT inhaler Inhale 2 puffs into the lungs 2 (two) times daily.   03/08/2017  . furosemide (LASIX) 40 MG tablet Take 40 mg by mouth 2 (two) times daily.   03/08/2017  . gabapentin (NEURONTIN) 600 MG tablet Take 600 mg by mouth 3 (three) times daily.   03/08/2017  . ipratropium (ATROVENT) 0.06 % nasal spray Place 1 spray into the nose 2 (two) times daily.   03/08/2017  . ipratropium-albuterol (DUONEB) 0.5-2.5 (3) MG/3ML SOLN Inhale 3 mLs into the lungs 2 (two) times daily.   03/08/2017  . metoprolol succinate (TOPROL-XL) 100 MG 24 hr tablet Take 150 mg by mouth daily.   03/08/2017  . warfarin (COUMADIN) 2.5 MG tablet Take 2.5 mg by mouth at bedtime.   03/07/2017    Assessment: 84 yo M on Coumadin PTA for hx afib.  Coumadin was held for hip fracture repair.  To restart 2/14 PM.  INR this AM 1.81.  Goal of Therapy:  INR 2-3 Monitor platelets by anticoagulation protocol: Yes   Plan:   Coumadin 4mg  PO X 1 tonight Daily INR  [employer], Pharm.D., BCPS Clinical Pharmacist 03/10/2017 8:34 PM

## 2017-03-10 NOTE — H&P (Addendum)
History and Physical    Wesley Baker TFT:[id number] DOB: [birth year] DOA: 03/10/2017  PCP: No primary care provider on file.  Patient coming from: Home.  Chief Complaint: Fall with right hip pain.  HPI: Wesley Baker is a 84 y.o. male with history of atrial fibrillation, possible CHF, COPD, hypertension, gout, chronic kidney disease, chronic anemia was brought to the ER at [facility] after patient had a mechanical fall.  Patient states he was walking when he suddenly had a fall.  Denies hitting his head or losing consciousness.  Patient was taken to [facility] and no other x-rays revealed right hip fracture.  There also was concern for patient's right knee which is swollen and x-rays do not show any definite fractures.  X-rays of right ankle show some cortical irregularity.  Chest x-ray was unremarkable EKG showing A. fib rate controlled.  ER physician at [facility] discussed with Dr. French Ana orthopedic surgeon at [facility] and patient is being transferred for further management.  On my exam patient denies any chest pain or shortness of breath.  Has mild lower extremity edema which patient states is chronic.  I reviewed the reports of patient's x-rays done at [facility] and also labs.  INR was 2.1.  Creatinine was around 1.3.  Hemoglobin was 13.5.  WBC was 9.5.  ED Course: Patient is a direct admit.  Review of Systems: As per HPI, rest all negative.   Past Medical History:  Diagnosis Date  . COPD (chronic obstructive pulmonary disease) ([place])   . Dysrhythmia   . Gout   . Hypertension   . Sleep apnea     Past Surgical History:  Procedure Laterality Date  . BACK SURGERY    . KNEE SURGERY    . PROSTATE SURGERY       reports that he has quit smoking. he has never used smokeless tobacco. He reports that he does not drink alcohol or use drugs.  Not on File  Family History  Problem Relation Age of Onset  . Diabetes Mellitus II Son      Prior to Admission medications   Not on File    Physical Exam: Vitals:   03/10/17 0245  BP: (!) 163/75  Pulse: 87  Temp: 97.9 F (36.6 C)  TempSrc: Oral  SpO2: 97%      Constitutional: Moderately built and nourished. Vitals:   03/10/17 0245  BP: (!) 163/75  Pulse: 87  Temp: 97.9 F (36.6 C)  TempSrc: Oral  SpO2: 97%   Eyes: Anicteric no pallor. ENMT: No discharge from the ears eyes nose or mouth. Neck: No mass with no JVD appreciated. Respiratory: No rhonchi or crepitations. Cardiovascular: S1-S2 heard no murmurs appreciated. Abdomen: Soft nontender bowel sounds present. Musculoskeletal: Right knee S effusion.  Swelling of the both lower extremities.  Pain on moving right hip. Skin: Chronic skin changes. Neurologic: Alert awake oriented to time place and person.  Moves all extremities. Psychiatric: Appears normal.  Normal affect.   Labs on Admission: I have personally reviewed following labs and imaging studies  CBC: No results for input(s): WBC, NEUTROABS, HGB, HCT, MCV, PLT in the last 168 hours. Basic Metabolic Panel: No results for input(s): NA, K, CL, CO2, GLUCOSE, BUN, CREATININE, CALCIUM, MG, PHOS in the last 168 hours. GFR: CrCl cannot be calculated (No order found.). Liver Function Tests: No results for input(s): AST, ALT, ALKPHOS, BILITOT, PROT, ALBUMIN in the last 168 hours. No results for input(s): LIPASE, AMYLASE in  the last 168 hours. No results for input(s): AMMONIA in the last 168 hours. Coagulation Profile: No results for input(s): INR, PROTIME in the last 168 hours. Cardiac Enzymes: No results for input(s): CKTOTAL, CKMB, CKMBINDEX, TROPONINI in the last 168 hours. BNP (last 3 results) No results for input(s): PROBNP in the last 8760 hours. HbA1C: No results for input(s): HGBA1C in the last 72 hours. CBG: No results for input(s): GLUCAP in the last 168 hours. Lipid Profile: No results for input(s): CHOL, HDL, LDLCALC, TRIG,  CHOLHDL, LDLDIRECT in the last 72 hours. Thyroid Function Tests: No results for input(s): TSH, T4TOTAL, FREET4, T3FREE, THYROIDAB in the last 72 hours. Anemia Panel: No results for input(s): VITAMINB12, FOLATE, FERRITIN, TIBC, IRON, RETICCTPCT in the last 72 hours. Urine analysis: No results found for: COLORURINE, APPEARANCEUR, LABSPEC, PHURINE, GLUCOSEU, HGBUR, BILIRUBINUR, KETONESUR, PROTEINUR, UROBILINOGEN, NITRITE, LEUKOCYTESUR Sepsis Labs: @LABRCNTIP (procalcitonin:4,lacticidven:4) )No results found for this or any previous visit (from the past 240 hour(s)).   Radiological Exams on Admission: No results found.  EKG: Independently reviewed.  A. fib with PVCs.  Assessment/Plan Principal Problem:   Closed right hip fracture, initial encounter [facility]) Active Problems:   Unspecified atrial fibrillation (HCC)   COPD (chronic obstructive pulmonary disease) (HCC)   Gout    1. Closed right hip fracture -patient appears to be at moderate risk for intermediate risk procedure.  We will keep patient n.p.o. after patient takes a snack in anticipation of surgery.  We will recheck INR if elevated will give vitamin K.  Patient will be on pain relief medications. 2. History of A. fib and hypertension we will keep patient on PRN IV hydralazine and scheduled dose of IV metoprolol until patient can take oral.  Check INR.  If elevated may need IV vitamin K.  The patient and patient's son is agreeable to giving way and knows the risk factor for stroke.  Restart anticoagulation after surgery immediately when okay with orthopedic surgeon. 3. COPD presently not actively wheezing on scheduled dose of DuoNeb's and steroid inhaler. 4. Chronic kidney disease stage II-III  -do not have any old labs to compare. 5. Possible CHF on Lasix -appears to be compensated at this time.  Lasix will be restarted after surgery. 6. History of gout on allopurinol. 7. Chronic anemia likely from chronic disease follow  CBC. 8. Right knee effusion and an abnormal x-ray of the right ankle will need further input from orthopedic surgery.   DVT prophylaxis: SCDs. Code Status: Full code. Family Communication: Patient's son. Disposition Plan: Possibly skilled nursing facility. Consults called: Orthopedic surgery. Admission status: Inpatient.   Rise Patience MD [facility] Pager [phone].  If 7PM-7AM, please contact night-coverage [email or web address] Password [email or web address]  03/10/2017, 3:50 AM

## 2017-03-10 NOTE — Progress Notes (Signed)
Orthopedic Tech Progress Note Patient Details:  Wesley Baker November 04, 1925 [id number]  Patient ID: Wesley Baker, male   DOB: Jun 18, 1925, 84 y.o.   MRN: [id number] Pt cant have ohf due to age restrictions.  Karolee Stamps 03/10/2017, 11:03 PM

## 2017-03-10 NOTE — Progress Notes (Signed)
Initial Nutrition Assessment  DOCUMENTATION CODES:   Non-severe (moderate) malnutrition in context of chronic illness  INTERVENTION:   Premier Protein po BID, each supplement provides 160 kcal and 30 grams of protein  MVI   NUTRITION DIAGNOSIS:   Moderate Malnutrition related to chronic illness(COPD, CHF) as evidenced by mild fat depletion, mild muscle depletion.   GOAL:   Patient will meet greater than or equal to 90% of their needs   MONITOR:   PO intake, Supplement acceptance, Diet advancement  REASON FOR ASSESSMENT:   Consult Hip fracture protocol  ASSESSMENT:   84 yo male admitted 2/14 Rt hip fracture. PMH COPD, CHF, Afib, gout, CKD, anemia.    Pt scheduled for surgery today (currently NPO), but has a good appetite (both PTA and in hospital).   S/w pt and pt's children.   Pt is currently very alert and oriented. He was able to ambulate w/o significant issues leading up to fall, but self-reports muscle loss over past few years. This has been noted in the NFPE. Pt denies changes in wt status. He lost ~40lbs 10 yrs ago when he quit drinking. Has maintained same wt of 182 lbs. Ht currently 5'10" per pt. Collectively based on evidence of stable wt with progressive muscle depletion and dietary recall, pt likely consuming inadequate protein to sustain muscle mass with comorbidities of COPD/CHF.  Pt has dentures. He does not always wear top set of dentures since they are ill-fitting. Regularly wears lower dentures. Pt's daughter mentioned pt had hx of aspiration pnuemonia, but pt denies any issues with swallowing or recent episodes of aspiration.    Dietary intake recall PTA:  B: Honey bun (large), muffin, mini donuts, coffee L/Snacks: Pt snacks on treats (e.g. crackers, cookies, ice cream) throughout the day and drinks coffee.  D: Homemade family dinners prepared by sons protein (chuck roast, fish, pork) with starch (e.g. Potato) and vegetables (1-1.5 cup broccoli,  cauliflower etc) Beverage-- drinks water  Pt does not drink supplemental nutrition at home. Agreed to try when diet advanced to help healing from surgery. Discussed with pt and daughter the importance of protein intake and consuming foods with calcium/vitamin D to maintain muscle and bone. Reviewed sources of protein in diet. Daughter suggested trying gummy vitamins.  Medications and Labs reviewed.   NUTRITION - FOCUSED PHYSICAL EXAM:    Most Recent Value  Orbital Region  Moderate depletion  Upper Arm Region  Mild depletion  Thoracic and Lumbar Region  No depletion  Buccal Region  Mild depletion  Temple Region  Moderate depletion  Clavicle Bone Region  Mild depletion  Clavicle and Acromion Bone Region  Mild depletion  Scapular Bone Region  Mild depletion  Dorsal Hand  Mild depletion  Patellar Region  No depletion  Anterior Thigh Region  Mild depletion  Posterior Calf Region  No depletion  Edema (RD Assessment)  Mild  Hair  Reviewed  Eyes  Reviewed  Mouth  Reviewed  Skin  Reviewed  Nails  Reviewed       Diet Order:  Diet regular Room service appropriate? Yes; Fluid consistency: Thin Diet NPO time specified Diet NPO time specified Except for: Sips with Meds  EDUCATION NEEDS:   Education needs have been addressed  Skin:  Skin Assessment: Reviewed RN Assessment  Last BM:  2/13  Height:   Ht Readings from Last 1 Encounters:  03/10/17 5\' 10"  (1.778 m)    Weight:   Wt Readings from Last 1 Encounters:  03/10/17 182 lb (82.6  kg)    Ideal Body Weight:  75.4 kg  BMI:  Body mass index is 26.11 kg/m.  Estimated Nutritional Needs:   Kcal:  1,750-1,950 kcal  Protein:  95-105 grams  Fluid:  1.7- 1.9 L    Edmonia Lynch, MS Dietetic Intern Pager: [phone]

## 2017-03-10 NOTE — Interval H&P Note (Signed)
History and Physical Interval Note:  03/10/2017 2:49 PM  Wesley Baker  has presented today for surgery, with the diagnosis of [place]  The various methods of treatment have been discussed with the patient and family. After consideration of risks, benefits and other options for treatment, the patient has consented to  Procedure(s): ARTHROPLASTY BIPOLAR HIP (HEMIARTHROPLASTY) (Right) as a surgical intervention .  The patient's history has been reviewed, patient examined, no change in status, stable for surgery.  I have reviewed the patient's chart and labs.  Questions were answered to the patient's satisfaction.     Carrina Schoenberger D

## 2017-03-10 NOTE — Anesthesia Postprocedure Evaluation (Signed)
Anesthesia Post Note  Patient: Wesley Baker  Procedure(s) Performed: ARTHROPLASTY BIPOLAR HIP (HEMIARTHROPLASTY) (Right Hip)     Patient location during evaluation: PACU Anesthesia Type: General Level of consciousness: awake and alert Pain management: pain level controlled Vital Signs Assessment: post-procedure vital signs reviewed and stable Respiratory status: spontaneous breathing, nonlabored ventilation, respiratory function stable and patient connected to nasal cannula oxygen Cardiovascular status: blood pressure returned to baseline and stable Postop Assessment: no apparent nausea or vomiting Anesthetic complications: no    Last Vitals:  Vitals:   03/10/17 1717 03/10/17 1730  BP: 126/64   Pulse: 68   Resp: (!) 21   Temp:  36.7 C  SpO2: 91%     Last Pain:  Vitals:   03/10/17 1730  TempSrc:   PainSc: 0-No pain                 Audry Pili

## 2017-03-11 DIAGNOSIS — M1000 Idiopathic gout, unspecified site: Secondary | ICD-10-CM

## 2017-03-11 DIAGNOSIS — E440 Moderate protein-calorie malnutrition: Secondary | ICD-10-CM | POA: Diagnosis present

## 2017-03-11 DIAGNOSIS — J410 Simple chronic bronchitis: Secondary | ICD-10-CM

## 2017-03-11 LAB — CBC
HCT: 39.1 % (ref 39.0–52.0)
HCT: 40.5 % (ref 39.0–52.0)
HEMOGLOBIN: 13 g/dL (ref 13.0–17.0)
Hemoglobin: 12.4 g/dL — ABNORMAL LOW (ref 13.0–17.0)
MCH: 29.5 pg (ref 26.0–34.0)
MCH: 29.6 pg (ref 26.0–34.0)
MCHC: 31.7 g/dL (ref 30.0–36.0)
MCHC: 32.1 g/dL (ref 30.0–36.0)
MCV: 92.9 fL (ref 78.0–100.0)
MCV: 92.3 fL (ref 78.0–100.0)
PLATELETS: 195 10*3/uL (ref 150–400)
Platelets: 184 10*3/uL (ref 150–400)
RBC: 4.21 MIL/uL — ABNORMAL LOW (ref 4.22–5.81)
RBC: 4.39 MIL/uL (ref 4.22–5.81)
RDW: 14.8 % (ref 11.5–15.5)
RDW: 14.8 % (ref 11.5–15.5)
WBC: 10.2 10*3/uL (ref 4.0–10.5)
WBC: 10.2 10*3/uL (ref 4.0–10.5)

## 2017-03-11 LAB — COMPREHENSIVE METABOLIC PANEL
ALK PHOS: 83 U/L (ref 38–126)
ALT: 12 U/L — ABNORMAL LOW (ref 17–63)
AST: 36 U/L (ref 15–41)
Albumin: 3.2 g/dL — ABNORMAL LOW (ref 3.5–5.0)
Anion gap: 11 (ref 5–15)
BUN: 32 mg/dL — AB (ref 6–20)
CALCIUM: 8.9 mg/dL (ref 8.9–10.3)
CHLORIDE: 100 mmol/L — AB (ref 101–111)
CO2: 27 mmol/L (ref 22–32)
CREATININE: 1.19 mg/dL (ref 0.61–1.24)
GFR calc Af Amer: >60 mL/min (ref >60)
GFR calc non Af Amer: 52 mL/min — ABNORMAL LOW (ref >60)
GLUCOSE: 143 mg/dL — AB (ref 65–99)
Potassium: 3.9 mmol/L (ref 3.5–5.1)
SODIUM: 138 mmol/L (ref 135–145)
Total Bilirubin: 2.4 mg/dL — ABNORMAL HIGH (ref 0.3–1.2)
Total Protein: 6.1 g/dL — ABNORMAL LOW (ref 6.5–8.1)

## 2017-03-11 LAB — PROTIME-INR
INR: 1.35
Prothrombin Time: 16.6 seconds — ABNORMAL HIGH (ref 11.4–15.2)

## 2017-03-11 MED ORDER — ALLOPURINOL 300 MG PO TABS
300.0000 mg | ORAL_TABLET | Freq: Every day | ORAL | Status: DC
  Administered 2017-03-11 – 2017-03-14 (×4): 300 mg via ORAL
  Filled 2017-03-11 (×4): qty 1

## 2017-03-11 MED ORDER — GABAPENTIN 300 MG PO CAPS
600.0000 mg | ORAL_CAPSULE | Freq: Three times a day (TID) | ORAL | Status: DC
  Administered 2017-03-11 – 2017-03-14 (×10): 600 mg via ORAL
  Filled 2017-03-11 (×10): qty 2

## 2017-03-11 MED ORDER — WARFARIN SODIUM 4 MG PO TABS
4.0000 mg | ORAL_TABLET | Freq: Once | ORAL | Status: AC
  Administered 2017-03-11: 4 mg via ORAL
  Filled 2017-03-11: qty 1

## 2017-03-11 MED ORDER — MOMETASONE FURO-FORMOTEROL FUM 200-5 MCG/ACT IN AERO: 2.0000 | INHALATION_SPRAY | Freq: Two times a day (BID) | RESPIRATORY_TRACT | Status: DC

## 2017-03-11 MED ORDER — IPRATROPIUM-ALBUTEROL 0.5-2.5 (3) MG/3ML IN SOLN
3.0000 mL | Freq: Two times a day (BID) | RESPIRATORY_TRACT | Status: DC
  Administered 2017-03-11 – 2017-03-14 (×7): 3 mL via RESPIRATORY_TRACT
  Filled 2017-03-11 (×7): qty 3

## 2017-03-11 MED ORDER — METOPROLOL SUCCINATE ER 50 MG PO TB24: 150.0000 mg | ORAL_TABLET | Freq: Every day | ORAL | Status: DC

## 2017-03-11 MED ORDER — FUROSEMIDE 40 MG PO TABS
40.0000 mg | ORAL_TABLET | Freq: Every day | ORAL | Status: DC
  Administered 2017-03-11 – 2017-03-14 (×4): 40 mg via ORAL
  Filled 2017-03-11 (×4): qty 1

## 2017-03-11 MED ORDER — ENOXAPARIN SODIUM 40 MG/0.4ML ~~LOC~~ SOLN
40.0000 mg | SUBCUTANEOUS | Status: DC
  Administered 2017-03-11 – 2017-03-14 (×4): 40 mg via SUBCUTANEOUS
  Filled 2017-03-11 (×4): qty 0.4

## 2017-03-11 MED ORDER — METOPROLOL TARTRATE 50 MG PO TABS
50.0000 mg | ORAL_TABLET | Freq: Two times a day (BID) | ORAL | Status: DC
  Administered 2017-03-11 – 2017-03-14 (×6): 50 mg via ORAL
  Filled 2017-03-11 (×7): qty 1

## 2017-03-11 NOTE — Evaluation (Signed)
Occupational Therapy Evaluation Patient Details Name: Wesley Baker MRN: [id number] DOB: 03-06-1925 Today's Date: 03/11/2017    History of Present Illness 84 y.o. male s/p R Hip Hemiarthroplasty 2/15. PMH includes: COPD, Dysrhythmia, Gout, HTN, Back Surgery, knee surgery, prostate surgery.    Clinical Impression   Pt admitted with the above diagnoses and presents with below problem list. Pt will benefit from continued acute OT to address the below listed deficits and maximize independence with ADLs prior to d/c to next venue. PTA pt reports he was using a walker and needed some assist with bathing/dressing. Pt is currently +2 mod-max physical A for LB ADLs and functional transfers. Pt completed SPT EOB>recliner this session. Pt needing heavy cueing to follow posterior hip precautions and will benefit from continued reinforcement of education on his precautions.      Follow Up Recommendations  SNF    Equipment Recommendations  Other (comment)(defer to next venue)    Recommendations for Other Services       Precautions / Restrictions Precautions Precautions: Fall;Posterior Hip Precaution Booklet Issued: Yes (comment) Precaution Comments: reviewed hip precautions- will need ctd reinforcement Restrictions Weight Bearing Restrictions: Yes RLE Weight Bearing: Weight bearing as tolerated      Mobility Bed Mobility Overal bed mobility: Needs Assistance Bed Mobility: Supine to Sit     Supine to sit: Mod assist     General bed mobility comments: mod A with surgical limb advancement and protection of hip precautions. Pt can tolerate sitting EOB without support.   Transfers Overall transfer level: Needs assistance Equipment used: Rolling walker (2 wheeled) Transfers: Sit to/from [facility] Sit to Stand: Mod assist;+2 physical assistance Stand pivot transfers: Min assist;+2 physical assistance       General transfer comment: mod A x2 to power up with heavy  emphasis on hip precautions. Cues for hand placement to promote adherence. pt can stand pivot with min A at this time.     Balance Overall balance assessment: Needs assistance Sitting-balance support: Bilateral upper extremity supported;Feet supported;Single extremity supported Sitting balance-Leahy Scale: Fair     Standing balance support: Bilateral upper extremity supported Standing balance-Leahy Scale: Poor                             ADL either performed or assessed with clinical judgement   ADL Overall ADL's : Needs assistance/impaired Eating/Feeding: Set up;Sitting   Grooming: Set up;Minimal assistance;Sitting   Upper Body Bathing: Set up;Minimal assistance;Sitting   Lower Body Bathing: Maximal assistance;+2 for physical assistance;Sit to/from stand   Upper Body Dressing : Minimal assistance;Set up;Sitting   Lower Body Dressing: Maximal assistance;+2 for physical assistance;Sit to/from stand   Toilet Transfer: Maximal assistance;Moderate assistance;+2 for physical assistance;Stand-pivot;RW   Toileting- Clothing Manipulation and Hygiene: Maximal assistance;+2 for physical assistance;Sit to/from stand   Tub/ Shower Transfer: Maximal assistance;+2 for physical assistance;Stand-pivot;Shower seat;Rolling walker     General ADL Comments: Pt completed bed mobility and pivotol steps from EOB to recliner. Multimodal cueing and assist to maintain precations during transfers. Spoke with Lulu Riding, via phone to update on therapy session, asked him to bring pt's shoes from home.      Vision         Perception     Praxis      Pertinent Vitals/Pain Pain Assessment: Faces Faces Pain Scale: Hurts little more Pain Location: R hip Pain Descriptors / Indicators: Discomfort;Aching Pain Intervention(s): Limited activity within patient's tolerance;Monitored during session;Repositioned  Hand Dominance     Extremity/Trunk Assessment Upper Extremity  Assessment Upper Extremity Assessment: Generalized weakness   Lower Extremity Assessment Lower Extremity Assessment: Defer to PT evaluation       Communication Communication Communication: HOH   Cognition Arousal/Alertness: Awake/alert;Suspect due to medications Behavior During Therapy: [facility] for tasks assessed/performed Overall Cognitive Status: Difficult to assess                                 General Comments: per patients son he is more "cloudy" then normal. able to follow simple 1 step commands but trouble processing complex cueing. Will need continued reinforcement of hip precautions.    General Comments   Left on 1L supplemental O2, nursing notified.        Shoulder Instructions      Home Living Family/patient expects to be discharged to:: Skilled nursing facility Living Arrangements: ( pt lives with adult son)                               Additional Comments: spoke with son and he is in agreement with SNF for rehab at this time      Prior Functioning/Environment Level of Independence: Independent with assistive device(s);Needs assistance  Gait / Transfers Assistance Needed: short distance ambulation with RW sometimes.  ADL's / Homemaking Assistance Needed: son assists with ADLs            OT Problem List: Impaired balance (sitting and/or standing);Decreased cognition;Decreased safety awareness;Decreased knowledge of use of DME or AE;Decreased knowledge of precautions;Pain      OT Treatment/Interventions: Self-care/ADL training;DME and/or AE instruction;Therapeutic activities;Patient/family education;Balance training    OT Goals(Current goals can be found in the care plan section) Acute Rehab OT Goals Patient Stated Goal: home OT Goal Formulation: With patient/family Time For Goal Achievement: 03/25/17 Potential to Achieve Goals: Good ADL Goals Pt Will Perform Lower Body Bathing: with min assist;sit to/from stand Pt Will  Perform Lower Body Dressing: with min assist;sit to/from stand Pt Will Transfer to Toilet: with min guard assist;ambulating Pt Will Perform Toileting - Clothing Manipulation and hygiene: with min assist;sit to/from stand Pt Will Perform Tub/Shower Transfer: with min guard assist;ambulating;shower seat;rolling walker  OT Frequency: Min 2X/week   Barriers to D/C:            Co-evaluation PT/OT/SLP Co-Evaluation/Treatment: Yes Reason for Co-Treatment: Complexity of the patient's impairments (multi-system involvement);To address functional/ADL transfers;For patient/therapist safety PT goals addressed during session: Mobility/safety with mobility;Balance;Proper use of DME;Strengthening/ROM        AM-PAC PT "6 Clicks" Daily Activity     Outcome Measure Help from another person eating meals?: None Help from another person taking care of personal grooming?: A Little Help from another person toileting, which includes using toliet, bedpan, or urinal?: A Lot Help from another person bathing (including washing, rinsing, drying)?: A Lot Help from another person to put on and taking off regular upper body clothing?: A Lot Help from another person to put on and taking off regular lower body clothing?: A Lot 6 Click Score: 15   End of Session Equipment Utilized During Treatment: Gait belt;Rolling walker Nurse Communication: Mobility status;Other (comment);Precautions(cognition; O2 sat)  Activity Tolerance: Patient tolerated treatment well Patient left: in chair;with call bell/phone within reach;Other (comment)(door open, on 1L O2 via [place])  OT Visit Diagnosis: Unsteadiness on feet (R26.81);Muscle weakness (generalized) (M62.81);Other symptoms and signs  involving cognitive function;Pain Pain - Right/Left: Right Pain - part of body: Hip                Time: 9987-2158 OT Time Calculation (min): 28 min Charges:    G-Codes:       Hortencia Pilar 03/11/2017, 1:43 PM

## 2017-03-11 NOTE — Progress Notes (Signed)
ANTICOAGULATION CONSULT NOTE - Follow Up Consult  Pharmacy Consult for Coumadin Indication: atrial fibrillation  No Known Allergies  Patient Measurements: Height: 5\' 10"  (177.8 cm) Weight: 182 lb (82.6 kg) IBW/kg (Calculated) : 73  Vital Signs: Temp: 98.1 F (36.7 C) (02/15 0348) Temp Source: Oral (02/15 0348) BP: 156/84 (02/15 0348) Pulse Rate: 65 (02/15 0348)  Labs: Recent Labs    03/10/17 0412 03/10/17 1253 03/11/17 0706 03/11/17 0814  HGB 13.8  --  12.4* 13.0  HCT 41.6  --  39.1 40.5  PLT 196  --  184 195  LABPROT 23.1* 20.8*  --  16.6*  INR 2.07 1.81  --  1.35  CREATININE 1.30*  --   --  1.19    Estimated Creatinine Clearance: 43.5 mL/min (by C-G formula based on SCr of 1.19 mg/dL).  Assessment: 84 year old male on Coumadin prior to admission for history of atrial fibrillation. Coumadin was initially held and patient received vitamin K 5mg  IV x1 on 2/14 in order to reverse INR for hip fracture repair on 2/14. Coumadin was resumed post-operatively.   INR today is sub-therapeutic at 1.35 after increased dose of 4mg  yesterday PM due to vitamin K resistance and multiple days without Coumadin.   Home regimen was 2.5mg  and 1.25mg  alternating every other day. Last dose was on 2/11 and took a 2.5mg  dose that day.   Goal of Therapy:  INR 2-3 Monitor platelets by anticoagulation protocol: Yes   Plan:  Coumadin 4mg  po x1 again tonight (increased dose in setting of vit K resistance) Daily PT/INR  Sloan Leiter, PharmD, BCPS, BCCCP Clinical Pharmacist Clinical phone 03/11/2017 until 3:30PM- #[phone] After hours, please call #[phone] 03/11/2017,11:26 AM

## 2017-03-11 NOTE — Social Work (Signed)
CSW confirmed with Arbie Cookey at [facility] that they can offer a SNF bed. Arbie Cookey indicated that they will start [place].  SNF will not have auth until Monday.  CSW will f/u for disposition.  Elissa Hefty, LCSW Clinical Social Worker [phone]

## 2017-03-11 NOTE — NC FL2 (Signed)
[place] LEVEL OF CARE SCREENING TOOL     IDENTIFICATION  Patient Name: Wesley Baker Birthdate: [birth year] Sex: male Admission Date (Current Location): 03/10/2017  [facility] and [place] Number:  [employer] and Address:  [facility] [place], [place] [address] [place], [place] [address]      Provider Number: [id number]  Attending Physician Name and Address:  Reyne Dumas, MD  Relative Name and Phone Number:  Baird Polinski, son,   [phone]     Current Level of Care: Hospital Recommended Level of Care: [place] Prior Approval Number:    Date Approved/Denied:   PASRR Number: [id number]  Discharge Plan: SNF    Current Diagnoses: Patient Active Problem List   Diagnosis Date Noted  . Malnutrition of moderate degree 03/11/2017  . Closed right hip fracture, initial encounter ([place]) 03/10/2017  . Unspecified atrial fibrillation ([place]) 03/10/2017  . COPD (chronic obstructive pulmonary disease) ([place]) 03/10/2017  . Gout 03/10/2017    Orientation RESPIRATION BLADDER Height & Weight     Self, Place, Time  O2(Nasal Cannula 2L) Continent Weight: 182 lb (82.6 kg) Height:  5\' 10"  (177.8 cm)  BEHAVIORAL SYMPTOMS/MOOD NEUROLOGICAL BOWEL NUTRITION STATUS      Continent Diet(See DC Summary)  AMBULATORY STATUS COMMUNICATION OF NEEDS Skin   Extensive Assist Verbally Surgical wounds                       Personal Care Assistance Level of Assistance  Bathing, Dressing, Feeding Bathing Assistance: Maximum assistance Feeding assistance: Limited assistance Dressing Assistance: Maximum assistance     Functional Limitations Info  Sight, Hearing, Speech Sight Info: Adequate Hearing Info: Adequate Speech Info: Adequate    SPECIAL CARE FACTORS FREQUENCY  PT (By licensed PT), OT (By licensed OT)     PT Frequency: 5x week OT Frequency: 5x week            Contractures      Additional Factors Info  Code Status,  Allergies Code Status Info: Full Allergies Info: No Known Allergies           Current Medications (03/11/2017):  This is the current hospital active medication list Current Facility-Administered Medications  Medication Dose Route Frequency Provider Last Rate Last Dose  . acetaminophen (TYLENOL) tablet 650 mg  650 mg Oral Q6H PRN Prudencio Burly III, PA-C       Or  . acetaminophen (TYLENOL) suppository 650 mg  650 mg Rectal Q6H PRN Prudencio Burly III, PA-C      . allopurinol (ZYLOPRIM) tablet 300 mg  300 mg Oral Daily Reyne Dumas, MD   300 mg at 03/11/17 1660  . enoxaparin (LOVENOX) injection 40 mg  40 mg Subcutaneous Q24H Abrol, Ascencion Dike, MD   40 mg at 03/11/17 0841  . furosemide (LASIX) tablet 40 mg  40 mg Oral Daily Reyne Dumas, MD   40 mg at 03/11/17 0841  . gabapentin (NEURONTIN) capsule 600 mg  600 mg Oral TID Reyne Dumas, MD   600 mg at 03/11/17 0837  . hydrALAZINE (APRESOLINE) injection 10 mg  10 mg Intravenous Q4H PRN Rise Patience, MD   10 mg at 03/10/17 0423  . HYDROcodone-acetaminophen (NORCO/VICODIN) 5-325 MG per tablet 1-2 tablet  1-2 tablet Oral Q6H PRN Prudencio Burly III, PA-C   2 tablet at 03/11/17 [phone]  . HYDROmorphone (DILAUDID) injection 0.25 mg  0.25 mg Intravenous Q4H PRN Prudencio Burly III, PA-C      .  ipratropium-albuterol (DUONEB) 0.5-2.5 (3) MG/3ML nebulizer solution 3 mL  3 mL Nebulization Q2H PRN Rise Patience, MD      . ipratropium-albuterol (DUONEB) 0.5-2.5 (3) MG/3ML nebulizer solution 3 mL  3 mL Inhalation BID Reyne Dumas, MD   3 mL at 03/11/17 0902  . lactated ringers infusion   Intravenous Continuous Prudencio Burly III, PA-C 50 mL/hr at 03/10/17 1807    . methocarbamol (ROBAXIN) tablet 500 mg  500 mg Oral Q6H PRN Rise Patience, MD   500 mg at 03/10/17 2353   Or  . methocarbamol (ROBAXIN) 500 mg in dextrose 5 % 50 mL IVPB  500 mg Intravenous Q6H PRN Rise Patience, MD      .  metoprolol tartrate (LOPRESSOR) tablet 50 mg  50 mg Oral BID Reyne Dumas, MD   50 mg at 03/11/17 0839  . mometasone-formoterol (DULERA) 100-5 MCG/ACT inhaler 2 puff  2 puff Inhalation BID Rise Patience, MD   2 puff at 03/11/17 [phone]  . multivitamin with minerals tablet 1 tablet  1 tablet Oral Daily Reyne Dumas, MD   1 tablet at 03/11/17 0838  . protein supplement (PREMIER PROTEIN) liquid  11 oz Oral TID BM Reyne Dumas, MD   11 oz at 03/11/17 0836  . warfarin (COUMADIN) tablet 4 mg  4 mg Oral ONCE-1800 Priscella Mann, RPH      . Warfarin - Pharmacist Dosing Inpatient   Does not apply q1800 Hammons, Theone Murdoch [facility]         Discharge Medications: Please see discharge summary for a list of discharge medications.  Relevant Imaging Results:  Relevant Lab Results:   Additional Information SS#: [id number]  [name], LCSW

## 2017-03-11 NOTE — Progress Notes (Signed)
[facility] PROGRESS NOTE  Wesley Baker AJO:[id number] DOB: April 16, 1925 DOA: 03/10/2017   PCP: No primary care provider on file.     Assessment/Plan: Principal Problem:   Closed right hip fracture, initial encounter [facility]) Active Problems:   Unspecified atrial fibrillation (HCC)   COPD (chronic obstructive pulmonary disease) ([place])   Gout    84 year old male with a history of atrial fibrillation on Coumadin, congestive heart failure, COPD, hypertension, chronic kidney disease, but no recent history of exacerbation of CHF, atrial fibrillation, who sustained a closed right hip fracture after a mechanical fall. Patient is on Coumadin and he received vitamin K in the ED.  Assessment and plan    1. Closed right hip fracture , status post arthroplasty - doing well postoperatively, deemed to be low to intermediate  risk procedure, will continue to monitor on telemetry postop.  Currently on Lovenox and Coumadin for DVT prophylaxis. DC Lovenox when INR is greater than 2.0.  Weightbearing as tolerated. Will need SNF 2. History of A. fib , rate controlled, and hypertension-continue to monitor on telemetry, continue metoprolol, change to short-acting given bradycardia. For  hypertension continue PRN IV hydralazine and monitor.   follow INR.   status post vitamin K.   resumed anticoagulation after surgery 3. COPD presently not actively wheezing on scheduled dose of DuoNeb's and steroid inhaler. 4. Chronic kidney disease stage II-III  -follow renal panel, stable 5. Possible CHF on Lasix -appears to be compensated at this time.  Lasix restarted after surgery. 6. History of gout on allopurinol. 7. Chronic anemia likely from chronic disease follow CBC. 8. Right knee effusion and an abnormal x-ray of the right ankle will need further input from orthopedic surgery.       DVT prophylaxsis Lovenox until INR greater than 2.0, Coumadin  Code Status:  Full code   Family Communication:  Discussed in detail with the patient, all imaging results, lab results explained to the patient   Disposition Plan:  SNF if bed available in am      Consultants:  Orthopedics  Procedures:  None  Antibiotics: Anti-infectives (From admission, onward)   Start     Dose/Rate Route Frequency Ordered Stop   03/11/17 0600  ceFAZolin (ANCEF) IVPB 2g/100 mL premix     2 g 200 mL/hr over 30 Minutes Intravenous On call to O.R. 03/10/17 1235 03/10/17 1510   03/10/17 2130  ceFAZolin (ANCEF) IVPB 2g/100 mL premix     2 g 200 mL/hr over 30 Minutes Intravenous Every 6 hours 03/10/17 1750 03/11/17 0424         HPI/Subjective: Patient in good spirits , denies any cp, sob   Objective: Vitals:   03/10/17 2100 03/10/17 2347 03/11/17 0008 03/11/17 0348  BP: (!) 147/59 (!) 154/86 (!) 145/69 (!) 156/84  Pulse: (!) 57 74 76 65  Resp: 18 18 18    Temp: 98 F (36.7 C) 98.3 F (36.8 C) 98 F (36.7 C) 98.1 F (36.7 C)  TempSrc: Oral Oral Oral Oral  SpO2: 98% 94% 94% 96%  Weight:      Height:        Intake/Output Summary (Last 24 hours) at 03/11/2017 0810 Last data filed at 03/10/2017 1606 Gross per 24 hour  Intake 1000 ml  Output 150 ml  Net 850 ml    Exam:  Examination:  General exam: Appears calm and comfortable  Respiratory system: Clear to auscultation. Respiratory effort normal. Cardiovascular system: S1 & S2 heard, RRR.  No JVD, murmurs, rubs, gallops or clicks. No pedal edema. Gastrointestinal system: Abdomen is nondistended, soft and nontender. No organomegaly or masses felt. Normal bowel sounds heard. Central nervous system: Alert and oriented. No focal neurological deficits. Extremities: Symmetric 5 x 5 power. Incision and dressing intact Skin: No rashes, lesions or ulcers Psychiatry: Judgement and insight appear normal. Mood & affect appropriate.     Data Reviewed: I have personally reviewed following labs and imaging studies  Micro Results No results found for  this or any previous visit (from the past 240 hour(s)).  Radiology Reports Pelvis Portable  Result Date: 03/10/2017 CLINICAL DATA:  Post right hip replacement. EXAM: PORTABLE PELVIS 1-2 VIEWS COMPARISON:  03/09/2017 FINDINGS: There has been a total right hip arthroplasty with removal of the fractured right femoral head and neck. The alignment is anatomic. No evidence of immediate complications. Heavy calcific atherosclerotic disease noted. IMPRESSION: Post total right hip arthroplasty without evidence of immediate complications. Electronically Signed   By: Fidela Salisbury M.D.   On: 03/10/2017 17:30     CBC Recent Labs  Lab 03/10/17 0412  WBC 10.3  HGB 13.8  HCT 41.6  PLT 196  MCV 92.2  MCH 30.6  MCHC 33.2  RDW 14.8  LYMPHSABS 0.3*  MONOABS 0.6  EOSABS 0.0  BASOSABS 0.0    Chemistries  Recent Labs  Lab 03/10/17 0412  NA 139  K 3.9  CL 96*  CO2 29  GLUCOSE 137*  BUN 24*  CREATININE 1.30*  CALCIUM 8.8*  AST 27  ALT 15*  ALKPHOS 94  BILITOT 2.8*   ------------------------------------------------------------------------------------------------------------------ estimated creatinine clearance is 39.8 mL/min (A) (by C-G formula based on SCr of 1.3 mg/dL (H)). ------------------------------------------------------------------------------------------------------------------ No results for input(s): HGBA1C in the last 72 hours. ------------------------------------------------------------------------------------------------------------------ No results for input(s): CHOL, HDL, LDLCALC, TRIG, CHOLHDL, LDLDIRECT in the last 72 hours. ------------------------------------------------------------------------------------------------------------------ No results for input(s): TSH, T4TOTAL, T3FREE, THYROIDAB in the last 72 hours.  Invalid input(s): FREET3 ------------------------------------------------------------------------------------------------------------------ No  results for input(s): VITAMINB12, FOLATE, FERRITIN, TIBC, IRON, RETICCTPCT in the last 72 hours.  Coagulation profile Recent Labs  Lab 03/10/17 0412 03/10/17 1253  INR 2.07 1.81    No results for input(s): DDIMER in the last 72 hours.  Cardiac Enzymes No results for input(s): CKMB, TROPONINI, MYOGLOBIN in the last 168 hours.  Invalid input(s): CK ------------------------------------------------------------------------------------------------------------------ Invalid input(s): POCBNP   CBG: No results for input(s): GLUCAP in the last 168 hours.     Studies: Pelvis Portable  Result Date: 03/10/2017 CLINICAL DATA:  Post right hip replacement. EXAM: PORTABLE PELVIS 1-2 VIEWS COMPARISON:  03/09/2017 FINDINGS: There has been a total right hip arthroplasty with removal of the fractured right femoral head and neck. The alignment is anatomic. No evidence of immediate complications. Heavy calcific atherosclerotic disease noted. IMPRESSION: Post total right hip arthroplasty without evidence of immediate complications. Electronically Signed   By: Fidela Salisbury M.D.   On: 03/10/2017 17:30      No results found for: HGBA1C Lab Results  Component Value Date   CREATININE 1.30 (H) 03/10/2017       Scheduled Meds: . enoxaparin (LOVENOX) injection  40 mg Subcutaneous Q24H  . metoprolol tartrate  5 mg Intravenous Q6H  . mometasone-formoterol  2 puff Inhalation BID  . multivitamin with minerals  1 tablet Oral Daily  . protein supplement shake  11 oz Oral TID BM  . Warfarin - Pharmacist Dosing Inpatient   Does not apply q1800   Continuous Infusions: . lactated ringers 50  mL/hr at 03/10/17 1807  . methocarbamol (ROBAXIN)  IV       LOS: 1 day    Time spent: >30 MINS    Reyne Dumas  [facility] Pager [phone]. If 7PM-7AM, please contact night-coverage at [email or web address], password [facility] 03/11/2017, 8:10 AM  LOS: 1 day

## 2017-03-11 NOTE — Progress Notes (Addendum)
    Subjective: Patient reports pain as mild, significantly improved since operation.  Tolerating some diet.  Urinating.   No CP, SOB.  Some bilateral paresthesias in feet - at baseline from spinal stenosis per son.  Not yet OOB.  Objective:   VITALS:   Vitals:   03/10/17 2100 03/10/17 2347 03/11/17 0008 03/11/17 0348  BP: (!) 147/59 (!) 154/86 (!) 145/69 (!) 156/84  Pulse: (!) 57 74 76 65  Resp: 18 18 18    Temp: 98 F (36.7 C) 98.3 F (36.8 C) 98 F (36.7 C) 98.1 F (36.7 C)  TempSrc: Oral Oral Oral Oral  SpO2: 98% 94% 94% 96%  Weight:      Height:       CBC Latest Ref Rng & Units 03/10/2017  WBC 4.0 - 10.5 K/uL 10.3  Hemoglobin 13.0 - 17.0 g/dL 13.8  Hematocrit 39.0 - 52.0 % 41.6  Platelets 150 - 400 K/uL 196   BMP Latest Ref Rng & Units 03/10/2017  Glucose 65 - 99 mg/dL 137(H)  BUN 6 - 20 mg/dL 24(H)  Creatinine 0.61 - 1.24 mg/dL 1.30(H)  Sodium 135 - 145 mmol/L 139  Potassium 3.5 - 5.1 mmol/L 3.9  Chloride 101 - 111 mmol/L 96(L)  CO2 22 - 32 mmol/L 29  Calcium 8.9 - 10.3 mg/dL 8.8(L)   Intake/Output      02/14 0701 - 02/15 0700 02/15 0701 - 02/16 0700   I.V. (mL/kg) 1000 (12.1)    Total Intake(mL/kg) 1000 (12.1)    Urine (mL/kg/hr)     Blood 150    Total Output 150    Net +850            Physical Exam: General: NAD.  Upright in bed.  Calm, conversant.  Son at bedside.  No increased work of breathing. MSK RLE: Neurovascularly intact Sensation intact distally Feet warm, TED hose in place Dorsiflexion/Plantar flexion intact Incision: dressing C/D/I   Assessment: 1 Day Post-Op  S/P Procedure(s) (LRB): ARTHROPLASTY BIPOLAR HIP (HEMIARTHROPLASTY) (Right) by Dr. Ernesta Amble. Percell Miller on 03/10/2017  Principal Problem:   Closed right hip fracture, initial encounter [facility]) Active Problems:   Unspecified atrial fibrillation (HCC)   COPD (chronic obstructive pulmonary disease) (HCC)   Gout   Closed displaced right femoral neck fracture, status post  hemiarthroplasty Doing well postop day 1.   Pain controlled and significantly improved since operation. Tolerating diet and urinating.  Not yet out of bed.  Plan: Up with therapy Incentive Spirometry Apply ice as needed Tylenol for mild pain.  Limit narcotics to breakthrough pain due to age, dementia.  Weight Bearing: Weight Bearing as Tolerated (WBAT) RLE Dressings: Maintain Mepilex.  VTE prophylaxis: Resume Coumadin for A. fib, SCDs, ambulation Dispo: TBD per primary.  Therapy evaluations pending.  Previously ambulatory with infrequent use of cane/walker.  He lives with his son.  We will continue to follow through the weekend.  Plan for follow-up with Dr. Alain Marion in ~2 weeks.     Charna Elizabeth Martensen III, PA-C 03/11/2017, 7:13 AM

## 2017-03-11 NOTE — Clinical Social Work Note (Addendum)
Clinical Social Work Assessment  Patient Details  Name: Wesley Baker MRN: [id number] Date of Birth: 1925-11-09  Date of referral:  03/11/17               Reason for consult:  Facility Placement                Permission sought to share information with:  Chartered certified accountant granted to share information::  Yes, Verbal Permission Granted  Name::     Wesley Baker  Agency::  SNF  Relationship::  Son  Contact Information:     Housing/Transportation Living arrangements for the past 2 months:  [place] of Information:  Patient, Adult Children Patient Interpreter Needed:  None Criminal Activity/Legal Involvement Pertinent to Current Situation/Hospitalization:  No - Comment as needed Significant Relationships:  Adult Children, Other Family Members Lives with:  Adult Children Do you feel safe going back to the place where you live?  No Need for family participation in patient care:  Yes (Comment)  Care giving concerns:  Pt is from from home with son. Pt desires to return home. Pt confirmed that he ambulated independently at home. CSW will review PT/OT notes and f/u. Pt agreed to same.  CSW discussed SNf needs with son Wesley Baker and he is in agreement. He indicated that his dad has never been to a SNF in the past, however, patient's doctor can access patient better at facility close by. Son indicated that he would consider [facility] and/or [facility]. CSW discussed the SNF process and insurance process. Son in agreement with understanding and really desires [facility]. CSW will f/u.  Social Worker assessment / plan:  CSW will f/u for disposition.  Employment status:  Retired Nurse, adult PT Recommendations:  [place] / Referral to community resources:  [place]  Patient/Family's Response to care:  Psychologist, prison and probation services of CSW assistance  with discussing SNF process as patient can return to nursing facility for therapy needs. No issues identified.  Patient/Family's Understanding of and Emotional Response to Diagnosis, Current Treatment, and Prognosis: Son and patient acknowledge and are in agreement with patient going to facility at discharge. Son already visited facility for placement and active in patient's care.  CSW will f/u with SNF for placement and assist with disposition. CSW will set up transported when ready. No other issues identified.    Emotional Assessment Appearance:  Appears stated age Attitude/Demeanor/Rapport:  (Cooeprative) Affect (typically observed):  Accepting, Appropriate Orientation:  Oriented to Self, Oriented to  Time, Oriented to Place Alcohol / Substance use:  Not Applicable Psych involvement (Current and /or in the community):  No (Comment)  Discharge Needs  Concerns to be addressed:  Discharge Planning Concerns Readmission within the last 30 days:  No Current discharge risk:  Dependent with Mobility, Physical Impairment Barriers to Discharge:  No Barriers Identified   [name], LCSW 03/11/2017, 10:08 AM

## 2017-03-11 NOTE — Evaluation (Signed)
Physical Therapy Evaluation Patient Details Name: Wesley Baker MRN: [id number] DOB: 04/29/1925 Today's Date: 03/11/2017   History of Present Illness  84 y.o. male s/p R Hip Hemiarthroplasty 2/15. PMH includes: COPD, Dysrhythmia, Gout, HTN, Back Surgery, knee surgery, prostate surgery.     Clinical Impression  Patient is s/p above surgery resulting in functional limitations due to the deficits listed below (see PT Problem List). PTA, pt living at home with son, ambulating with  intermittent use of RW.  Upon eval patient presents with post op pain and weakness, as well as mild cognitive deficits that limit his safety and mobility. Pt currently mod A for mobility and able to transfer to bedside chair today. Pt had a challenge adhering to hip precautions during transitional movements today and will need continued reinforcement in subsequent sessions. Family not present for family education, per OT phone call with son agreeable to SNF rec. Patient will benefit from skilled PT to increase their independence and safety with mobility to allow discharge to the venue listed below.       Follow Up Recommendations SNF;Supervision/Assistance - 24 hour    Equipment Recommendations  3in1 (PT)    Recommendations for Other Services       Precautions / Restrictions Precautions Precautions: Fall;Posterior Hip Precaution Booklet Issued: Yes (comment) Precaution Comments: reviewed hip precautions- will need ctd reinforcement Restrictions Weight Bearing Restrictions: Yes RLE Weight Bearing: Weight bearing as tolerated      Mobility  Bed Mobility Overal bed mobility: Needs Assistance Bed Mobility: Supine to Sit     Supine to sit: Mod assist     General bed mobility comments: mod A with surgical limb advancement and protection of hip precautions. Pt can tolerate sitting EOB without support.   Transfers Overall transfer level: Needs assistance Equipment used: Rolling walker (2  wheeled) Transfers: Sit to/from [facility] Sit to Stand: Mod assist;+2 physical assistance Stand pivot transfers: Min assist;+2 physical assistance       General transfer comment: mod A x2 to power up with heavy emphasis on hip precautions. Cues for hand placement to promote adherence. pt can stand pivot with min A at this time.   Ambulation/Gait                Stairs            Wheelchair Mobility    Modified Rankin (Stroke Patients Only)       Balance Overall balance assessment: Needs assistance Sitting-balance support: Bilateral upper extremity supported;Feet supported;Single extremity supported Sitting balance-Leahy Scale: Fair     Standing balance support: Bilateral upper extremity supported Standing balance-Leahy Scale: Poor                               Pertinent Vitals/Pain Pain Assessment: Faces Faces Pain Scale: Hurts little more Pain Location: R hip Pain Descriptors / Indicators: Discomfort;Aching Pain Intervention(s): Limited activity within patient's tolerance;Monitored during session;Repositioned    Home Living Family/patient expects to be discharged to:: Skilled nursing facility Living Arrangements: ( pt lives with adult son)               Additional Comments: spoke with son and he is in agreement with SNF for rehab at this time    Prior Function Level of Independence: Independent with assistive device(s);Needs assistance   Gait / Transfers Assistance Needed: short distance ambulation with RW sometimes.   ADL's / Homemaking Assistance Needed: son assists with  ADLs        Hand Dominance        Extremity/Trunk Assessment   Upper Extremity Assessment Upper Extremity Assessment: Generalized weakness    Lower Extremity Assessment Lower Extremity Assessment: Defer to PT evaluation       Communication   Communication: HOH  Cognition Arousal/Alertness: Awake/alert;Suspect due to  medications Behavior During Therapy: [facility] for tasks assessed/performed Overall Cognitive Status: Difficult to assess                                 General Comments: per patients son he is more "cloudy" then normal. able to follow simple 1 step commands but trouble processing complex cueing. Will need continued reinforcement of hip precautions.       General Comments      Exercises General Exercises - Lower Extremity Ankle Circles/Pumps: 20 reps   Assessment/Plan    PT Assessment Patient needs continued PT services  PT Problem List Decreased strength;Decreased range of motion;Decreased activity tolerance;Decreased balance;Decreased mobility;Decreased coordination;Decreased cognition;Decreased knowledge of use of DME;Decreased safety awareness;Pain;Decreased knowledge of precautions       PT Treatment Interventions DME instruction;Stair training;Gait training;Functional mobility training;Therapeutic activities;Therapeutic exercise;Balance training    PT Goals (Current goals can be found in the Care Plan section)  Acute Rehab PT Goals Patient Stated Goal: home PT Goal Formulation: With patient Time For Goal Achievement: 03/18/17 Potential to Achieve Goals: Poor    Frequency Min 3X/week   Barriers to discharge        Co-evaluation PT/OT/SLP Co-Evaluation/Treatment: Yes Reason for Co-Treatment: Complexity of the patient's impairments (multi-system involvement);To address functional/ADL transfers;For patient/therapist safety PT goals addressed during session: Mobility/safety with mobility;Balance;Proper use of DME;Strengthening/ROM         AM-PAC PT "6 Clicks" Daily Activity  Outcome Measure Difficulty turning over in bed (including adjusting bedclothes, sheets and blankets)?: Unable Difficulty moving from lying on back to sitting on the side of the bed? : Unable Difficulty sitting down on and standing up from a chair with arms (e.g., wheelchair, bedside  commode, etc,.)?: Unable Help needed moving to and from a bed to chair (including a wheelchair)?: A Lot Help needed walking in hospital room?: A Lot Help needed climbing 3-5 steps with a railing? : Total 6 Click Score: 8    End of Session Equipment Utilized During Treatment: Gait belt Activity Tolerance: Patient limited by fatigue;Patient limited by pain;Patient tolerated treatment well Patient left: in chair;with call bell/phone within reach Nurse Communication: Mobility status PT Visit Diagnosis: Unsteadiness on feet (R26.81);Other abnormalities of gait and mobility (R26.89);Muscle weakness (generalized) (M62.81);Pain Pain - Right/Left: Right Pain - part of body: Hip    Time: 1140-1220 PT Time Calculation (min) (ACUTE ONLY): 40 min   Charges:   PT Evaluation $PT Eval Moderate Complexity: 1 Mod PT Treatments $Therapeutic Activity: 8-22 mins   PT G Codes:        Reinaldo Berber, PT, DPT Acute Rehab Services Pager: [phone]    Reinaldo Berber 03/11/2017, 1:52 PM

## 2017-03-12 LAB — CBC
HCT: 37.2 % — ABNORMAL LOW (ref 39.0–52.0)
Hemoglobin: 12 g/dL — ABNORMAL LOW (ref 13.0–17.0)
MCH: 29.9 pg (ref 26.0–34.0)
MCHC: 32.3 g/dL (ref 30.0–36.0)
MCV: 92.5 fL (ref 78.0–100.0)
PLATELETS: 180 10*3/uL (ref 150–400)
RBC: 4.02 MIL/uL — ABNORMAL LOW (ref 4.22–5.81)
RDW: 14.8 % (ref 11.5–15.5)
WBC: 8.3 10*3/uL (ref 4.0–10.5)

## 2017-03-12 LAB — PROTIME-INR
INR: 1.47
PROTHROMBIN TIME: 17.7 s — AB (ref 11.4–15.2)

## 2017-03-12 MED ORDER — WARFARIN SODIUM 2.5 MG PO TABS
2.5000 mg | ORAL_TABLET | Freq: Once | ORAL | Status: AC
  Administered 2017-03-12: 2.5 mg via ORAL
  Filled 2017-03-12: qty 1

## 2017-03-12 NOTE — Progress Notes (Signed)
IV per patient. Messaged Dr. Auburn Bilberry wanting to know if another IV site is needed. Awaiting response.

## 2017-03-12 NOTE — Progress Notes (Signed)
ANTICOAGULATION CONSULT NOTE - Follow Up Consult  Pharmacy Consult for Coumadin Indication: atrial fibrillation  No Known Allergies  Patient Measurements: Height: 5\' 10"  (177.8 cm) Weight: 182 lb (82.6 kg) IBW/kg (Calculated) : 73  Vital Signs: Temp: 97.9 F (36.6 C) (02/16 0543) Temp Source: Oral (02/16 0543) BP: 138/68 (02/16 0543) Pulse Rate: 80 (02/16 0543)  Labs: Recent Labs    03/10/17 0412 03/10/17 1253 03/11/17 0706 03/11/17 0814 03/12/17 0442  HGB 13.8  --  12.4* 13.0 12.0*  HCT 41.6  --  39.1 40.5 37.2*  PLT 196  --  184 195 180  LABPROT 23.1* 20.8*  --  16.6* 17.7*  INR 2.07 1.81  --  1.35 1.47  CREATININE 1.30*  --   --  1.19  --     Estimated Creatinine Clearance: 43.5 mL/min (by C-G formula based on SCr of 1.19 mg/dL).  Assessment: 84 year old male on Coumadin prior to admission for history of atrial fibrillation. Coumadin was initially held and patient received vitamin K 5mg  IV x1 on 2/14 in order to reverse INR for hip fracture repair on 2/14. Coumadin was resumed post-operatively.   INR today is sub-therapeutic at 1.47 but rising 4mg  x2due to vitamin K resistance and multiple days without Coumadin.   Home regimen was 2.5mg  and 1.25mg  alternating every other day. Last dose was on 2/11 and took a 2.5mg  dose that day.   Goal of Therapy:  INR 2-3 Monitor platelets by anticoagulation protocol: Yes   Plan:  Coumadin 2.5mg  po x1 tonight (have been giving increased doses in setting of vit K resistance- watching INR closely) Daily PT/INR  Sloan Leiter, PharmD, BCPS, BCCCP Clinical Pharmacist Clinical phone 03/12/2017 until 3:30PM- #[phone] After hours, please call [phone] 03/12/2017,7:07 AM

## 2017-03-12 NOTE — Progress Notes (Signed)
Patient ID: Wesley Baker, male   DOB: 1925/09/20, 84 y.o.   MRN: [id number]     Subjective:  Patient reports pain as mild to moderate.  Patient family at the bed side and reports that the mental status is better.  Objective:   VITALS:   Vitals:   03/12/17 0543 03/12/17 0822 03/12/17 0857 03/12/17 0912  BP: 138/68 (!) 126/55 (!) 126/55 (!) 116/52  Pulse: 80 83 83 82  Resp: 17 16    Temp: 97.9 F (36.6 C) 98 F (36.7 C)    TempSrc: Oral Oral    SpO2: 98% 97%    Weight:      Height:        ABD soft Sensation intact distally Dorsiflexion/Plantar flexion intact Incision: dressing C/D/I and no drainage   Lab Results  Component Value Date   WBC 8.3 03/12/2017   HGB 12.0 (L) 03/12/2017   HCT 37.2 (L) 03/12/2017   MCV 92.5 03/12/2017   PLT 180 03/12/2017   BMET    Component Value Date/Time   NA 138 03/11/2017 0814   K 3.9 03/11/2017 0814   CL 100 (L) 03/11/2017 0814   CO2 27 03/11/2017 0814   GLUCOSE 143 (H) 03/11/2017 0814   BUN 32 (H) 03/11/2017 0814   CREATININE 1.19 03/11/2017 0814   CALCIUM 8.9 03/11/2017 0814   GFRNONAA 52 (L) 03/11/2017 0814   GFRAA >60 03/11/2017 0814     Assessment/Plan: 2 Days Post-Op   Principal Problem:   Closed right hip fracture, initial encounter ([facility]) Active Problems:   Unspecified atrial fibrillation (HCC)   COPD (chronic obstructive pulmonary disease) (HCC)   Gout   Malnutrition of moderate degree   Advance diet Up with therapy WBAT Dry dressing PRN Continue plan per medicine     Remonia Richter 03/12/2017, 10:14 AM   Marchia Bond, MD Cell [phone]

## 2017-03-12 NOTE — Progress Notes (Signed)
PROGRESS NOTE  SILVER PARKEY  TIR:[id number] DOB: 1925/02/19 DOA: 03/10/2017 PCP: None Brief Narrative: Wesley Baker is an 84 y.o. male with a history of AFib on coumadin, chronic CHF, COPD, HTN who presented after a mechanical fall with right hip fracture. Orthopedic surgery was consulted and the patient had arthroplasty 2/14 by Dr. Fredonia Highland. Vitamin K was given to reverse coumadin, which was resumed postoperatively with lovenox bridging while awaiting insurance authorization for SNF placement as recommended by PT.  Assessment & Plan: Closed right hip fracture: s/p arthroplasty bipolar hip 2/14.  - WBAT - SNF disposition per PT/OT recommendations - Post op DVT ppx will be chronic coumadin.  - Pain control per orthopedics.   AFib: rate controlled. - Continue metoprolol - Continue coumadin, daily INR, bridging with lovenox. Does NOT need to be therapeutic prior to discharge for this indication.  HTN:  - PRN IV hydralazine  COPD: No wheezing/exacerbation.  - Continue DuoNebs scheduled, prn and ICS.   Chronic kidney disease stage II-III: Not definitely sure of baseline Cr.  - Monitor BMP 2/18 and dose medications accordingly.   Chronic CHF: Euvolemic. No echo in EMR/care everywhere.  - Continue home medications.   Gout:  - Continue allopurinol.   Acute blood loss anemia: Mild.  - Recheck at follow up  Moderate malnutrition:  - Supplements as ordered, RD consulted.  DVT prophylaxis: Lovenox, coumadin Code Status: Full Family Communication: None at bedside on my evaluation Disposition Plan: SNF 2/18 pending insurance authorization. Medically stable for discharge. Consultants: Orthopedics, Dr. Percell Miller 03/10/2017: ARTHROPLASTY BIPOLAR HIP (HEMIARTHROPLASTY)  Subjective: Pain controlled, speech is slow but he is oriented. Family have remarked that mental status is at baseline.   Objective: Vitals:   03/12/17 0822 03/12/17 0857 03/12/17 0912 03/12/17 1354  BP: (!)  126/55 (!) 126/55 (!) 116/52 134/65  Pulse: 83 83 82 90  Resp: 16   17  Temp: 98 F (36.7 C)   98.1 F (36.7 C)  TempSrc: Oral   Oral  SpO2: 97%   93%  Weight:      Height:        Intake/Output Summary (Last 24 hours) at 03/12/2017 1557 Last data filed at 03/12/2017 1248 Gross per 24 hour  Intake 360 ml  Output 900 ml  Net -540 ml   Filed Weights   03/10/17 1116  Weight: 82.6 kg (182 lb)    Gen: Frail elderly male in no distress Pulm: Non-labored breathing room air. Clear to auscultation bilaterally.  CV: Regular rate and rhythm. No murmur, rub, or gallop. No JVD, no pedal edema. GI: Abdomen soft, non-tender, non-distended, with normoactive bowel sounds. No organomegaly or masses felt. Ext: Warm, no deformities. Compartments soft, SILT, motor function is preserved and cap refill brisk.  Skin: Incision dressings c/d/i. Neuro: Alert and oriented. No focal neurological deficits. Psych: Judgement and insight appear normal. Mood & affect appropriate.   Data Reviewed: I have personally reviewed following labs and imaging studies  CBC: Recent Labs  Lab 03/10/17 0412 03/11/17 0706 03/11/17 0814 03/12/17 0442  WBC 10.3 10.2 10.2 8.3  NEUTROABS 9.4*  --   --   --   HGB 13.8 12.4* 13.0 12.0*  HCT 41.6 39.1 40.5 37.2*  MCV 92.2 92.9 92.3 92.5  PLT 196 184 195 676   Basic Metabolic Panel: Recent Labs  Lab 03/10/17 0412 03/11/17 0814  NA 139 138  K 3.9 3.9  CL 96* 100*  CO2 29 27  GLUCOSE 137* 143*  BUN 24* 32*  CREATININE 1.30* 1.19  CALCIUM 8.8* 8.9   GFR: Estimated Creatinine Clearance: 43.5 mL/min (by C-G formula based on SCr of 1.19 mg/dL). Liver Function Tests: Recent Labs  Lab 03/10/17 0412 03/11/17 0814  AST 27 36  ALT 15* 12*  ALKPHOS 94 83  BILITOT 2.8* 2.4*  PROT 6.6 6.1*  ALBUMIN 3.6 3.2*   No results for input(s): LIPASE, AMYLASE in the last 168 hours. No results for input(s): AMMONIA in the last 168 hours. Coagulation Profile: Recent Labs    Lab 03/10/17 0412 03/10/17 1253 03/11/17 0814 03/12/17 0442  INR 2.07 1.81 1.35 1.47   Cardiac Enzymes: No results for input(s): CKTOTAL, CKMB, CKMBINDEX, TROPONINI in the last 168 hours. BNP (last 3 results) No results for input(s): PROBNP in the last 8760 hours. HbA1C: No results for input(s): HGBA1C in the last 72 hours. CBG: No results for input(s): GLUCAP in the last 168 hours. Lipid Profile: No results for input(s): CHOL, HDL, LDLCALC, TRIG, CHOLHDL, LDLDIRECT in the last 72 hours. Thyroid Function Tests: No results for input(s): TSH, T4TOTAL, FREET4, T3FREE, THYROIDAB in the last 72 hours. Anemia Panel: No results for input(s): VITAMINB12, FOLATE, FERRITIN, TIBC, IRON, RETICCTPCT in the last 72 hours. Urine analysis: No results found for: COLORURINE, APPEARANCEUR, LABSPEC, PHURINE, GLUCOSEU, HGBUR, BILIRUBINUR, KETONESUR, PROTEINUR, UROBILINOGEN, NITRITE, LEUKOCYTESUR No results found for this or any previous visit (from the past 240 hour(s)).    Radiology Studies: Pelvis Portable  Result Date: 03/10/2017 CLINICAL DATA:  Post right hip replacement. EXAM: PORTABLE PELVIS 1-2 VIEWS COMPARISON:  03/09/2017 FINDINGS: There has been a total right hip arthroplasty with removal of the fractured right femoral head and neck. The alignment is anatomic. No evidence of immediate complications. Heavy calcific atherosclerotic disease noted. IMPRESSION: Post total right hip arthroplasty without evidence of immediate complications. Electronically Signed   By: Fidela Salisbury M.D.   On: 03/10/2017 17:30    Scheduled Meds: . allopurinol  300 mg Oral Daily  . enoxaparin (LOVENOX) injection  40 mg Subcutaneous Q24H  . furosemide  40 mg Oral Daily  . gabapentin  600 mg Oral TID  . ipratropium-albuterol  3 mL Inhalation BID  . metoprolol tartrate  50 mg Oral BID  . mometasone-formoterol  2 puff Inhalation BID  . multivitamin with minerals  1 tablet Oral Daily  . protein supplement  shake  11 oz Oral TID BM  . warfarin  2.5 mg Oral ONCE-1800  . Warfarin - Pharmacist Dosing Inpatient   Does not apply q1800   Continuous Infusions: . lactated ringers 50 mL/hr at 03/10/17 1807  . methocarbamol (ROBAXIN)  IV       LOS: 2 days   Time spent: 15 minutes.  Vance Gather, MD [facility] Pager [phone]  If 7PM-7AM, please contact night-coverage [email or web address] Password [facility] 03/12/2017, 3:57 PM

## 2017-03-13 LAB — PROTIME-INR
INR: 1.83
PROTHROMBIN TIME: 21 s — AB (ref 11.4–15.2)

## 2017-03-13 MED ORDER — WARFARIN 1.25 MG HALF TABLET
1.2500 mg | ORAL_TABLET | Freq: Once | ORAL | Status: AC
  Administered 2017-03-13: 1.25 mg via ORAL
  Filled 2017-03-13: qty 1

## 2017-03-13 NOTE — Progress Notes (Signed)
Wesley Baker  BJS:[id number] DOB: 1925-11-16 DOA: 03/10/2017 PCP: None Brief Narrative: Wesley Baker is an 84 y.o. male with a history of AFib on coumadin, chronic CHF, COPD, HTN who presented after a mechanical fall with right hip fracture. Orthopedic surgery was consulted and the patient had arthroplasty 2/14 by Dr. Fredonia Highland. Vitamin K was given to reverse coumadin, which was resumed postoperatively with lovenox bridging while awaiting insurance authorization for SNF placement as recommended by PT.  Assessment & Plan: Closed right hip fracture: s/p arthroplasty bipolar hip 2/14.  - WBAT - SNF disposition per PT/OT recommendations - Post op DVT ppx will be chronic coumadin.  - Pain control per orthopedics.   AFib: rate controlled. - Continue metoprolol. - Continue coumadin, daily INR, bridging with lovenox. Does NOT need to be therapeutic prior to discharge for this indication. Will continue previous home dosing at discharge (2.5mg  / 1.25mg  alternating) which has been stable for years.  HTN:  - PRN IV hydralazine  COPD: No wheezing/exacerbation.  - Continue DuoNebs scheduled, prn and ICS.   Chronic kidney disease stage II-III: Not definitely sure of baseline Cr.  - Monitor BMP 2/18 and dose medications accordingly.   Chronic CHF: Euvolemic. No echo in EMR/care everywhere.  - Continue home medications.   Gout:  - Continue allopurinol.   Acute blood loss anemia: Mild.  - Recheck at follow up  Moderate malnutrition:  - Supplements as ordered, RD consulted.  DVT prophylaxis: Lovenox, coumadin Code Status: Full Family Communication: Son at bedside Disposition Plan: SNF 2/18 pending insurance authorization. Medically stable for discharge. Consultants: Orthopedics, Dr. Percell Miller 03/10/2017: ARTHROPLASTY BIPOLAR HIP (HEMIARTHROPLASTY)  Subjective: Feeling better, son at bedside reports he's gaining mental clarity. Speech speed is improving. Mobilizing.    Objective: BP 124/66 (BP Location: Right Arm)   Pulse 63   Temp 97.6 F (36.4 C) (Oral)   Resp 17   Ht 5\' 10"  (1.778 m)   Wt 82.6 kg (182 lb)   SpO2 98%   BMI 26.11 kg/m   Gen: Frail elderly male in no distress Pulm: Clear and nonlabored, no wheezing CV: Regular rate and rhythm. No murmur, rub, or gallop. No JVD, no pedal edema. GI: Abdomen soft, non-tender, non-distended, with normoactive bowel sounds. No organomegaly or masses felt. Ext: Warm, no deformities. Compartments soft, SILT, motor function is preserved and cap refill brisk.  Skin: Incision dressings c/d/i. Neuro: Alert and oriented. No focal neurological deficits. Psych: Judgement and insight appear intact. Mood & affect appropriate.   CBC: Recent Labs  Lab 03/10/17 0412 03/11/17 0706 03/11/17 0814 03/12/17 0442  WBC 10.3 10.2 10.2 8.3  NEUTROABS 9.4*  --   --   --   HGB 13.8 12.4* 13.0 12.0*  HCT 41.6 39.1 40.5 37.2*  MCV 92.2 92.9 92.3 92.5  PLT 196 184 195 607   Basic Metabolic Panel: Recent Labs  Lab 03/10/17 0412 03/11/17 0814  NA 139 138  K 3.9 3.9  CL 96* 100*  CO2 29 27  GLUCOSE 137* 143*  BUN 24* 32*  CREATININE 1.30* 1.19  CALCIUM 8.8* 8.9   Liver Function Tests: Recent Labs  Lab 03/10/17 0412 03/11/17 0814  AST 27 36  ALT 15* 12*  ALKPHOS 94 83  BILITOT 2.8* 2.4*  PROT 6.6 6.1*  ALBUMIN 3.6 3.2*   Coagulation Profile: Recent Labs  Lab 03/10/17 0412 03/10/17 1253 03/11/17 0814 03/12/17 0442 03/13/17 0547  INR 2.07 1.81 1.35 1.47 1.83  LOS: 3 days   Time spent: 15 minutes.  Vance Gather, MD [facility] Pager [phone]  If 7PM-7AM, please contact night-coverage [email or web address] Password [email or web address] 03/13/2017, 1:58 PM

## 2017-03-13 NOTE — Progress Notes (Signed)
Patient ID: Wesley Baker, male   DOB: 1925/10/29, 84 y.o.   MRN: [id number]     Subjective:  Patient reports pain as mild.  Patient better today and able to follow all commands and is aware of time and place  Objective:   VITALS:   Vitals:   03/12/17 2121 03/13/17 0420 03/13/17 0937 03/13/17 1503  BP: (!) 117/45 124/66  (!) 155/63  Pulse: 79 63  96  Resp:  17  16  Temp:  97.6 F (36.4 C)  97.7 F (36.5 C)  TempSrc:  Oral  Oral  SpO2:  96% 98% 95%  Weight:      Height:        ABD soft Sensation intact distally Dorsiflexion/Plantar flexion intact Incision: dressing C/D/I and no drainage   Lab Results  Component Value Date   WBC 8.3 03/12/2017   HGB 12.0 (L) 03/12/2017   HCT 37.2 (L) 03/12/2017   MCV 92.5 03/12/2017   PLT 180 03/12/2017   BMET    Component Value Date/Time   NA 138 03/11/2017 0814   K 3.9 03/11/2017 0814   CL 100 (L) 03/11/2017 0814   CO2 27 03/11/2017 0814   GLUCOSE 143 (H) 03/11/2017 0814   BUN 32 (H) 03/11/2017 0814   CREATININE 1.19 03/11/2017 0814   CALCIUM 8.9 03/11/2017 0814   GFRNONAA 52 (L) 03/11/2017 0814   GFRAA >60 03/11/2017 0814     Assessment/Plan: 3 Days Post-Op   Principal Problem:   Closed right hip fracture, initial encounter ([facility]) Active Problems:   Unspecified atrial fibrillation (HCC)   COPD (chronic obstructive pulmonary disease) (HCC)   Gout   Malnutrition of moderate degree   Advance diet Up with therapy Continue plan per medicine WBAT Plan for SNF  Dry dressing PRN   Remonia Richter 03/13/2017, 4:26 PM   Marchia Bond, MD Cell [phone]

## 2017-03-13 NOTE — Progress Notes (Signed)
ANTICOAGULATION CONSULT NOTE - Follow Up Consult  Pharmacy Consult for Coumadin Indication: atrial fibrillation  No Known Allergies  Patient Measurements: Height: 5\' 10"  (177.8 cm) Weight: 182 lb (82.6 kg) IBW/kg (Calculated) : 73  Vital Signs: Temp: 97.6 F (36.4 C) (02/17 0420) Temp Source: Oral (02/17 0420) BP: 124/66 (02/17 0420) Pulse Rate: 63 (02/17 0420)  Labs: Recent Labs    03/11/17 0706 03/11/17 0814 03/12/17 0442 03/13/17 0547  HGB 12.4* 13.0 12.0*  --   HCT 39.1 40.5 37.2*  --   PLT 184 195 180  --   LABPROT  --  16.6* 17.7* 21.0*  INR  --  1.35 1.47 1.83  CREATININE  --  1.19  --   --     Estimated Creatinine Clearance: 43.5 mL/min (by C-G formula based on SCr of 1.19 mg/dL).  Assessment: 84 year old male on Coumadin prior to admission for history of atrial fibrillation. Coumadin was initially held and patient received vitamin K 5mg  IV x1 on 2/14 in order to reverse INR for hip fracture repair on 2/14. Coumadin was resumed post-operatively.   INR today is sub-therapeutic at 1.83 but rising 4mg  x2 due to vitamin K resistance and multiple days without Coumadin.   Home regimen was 2.5mg  and 1.25mg  alternating every other day. Last dose was on 2/11 and took a 2.5mg  dose that day.   Goal of Therapy:  INR 2-3 Monitor platelets by anticoagulation protocol: Yes   Plan:  Coumadin 1.25mg  po x1 tonight (home regimen) Daily PT/INR *If discharges, can likely resume home dose of 2.5mg  and 1.25mg  alternating every other day with INR check in 1-2 days.   Sloan Leiter, PharmD, BCPS, BCCCP Clinical Pharmacist Clinical phone 03/13/2017 until 3:[phone] After hours, please call #[phone] 03/13/2017,9:10 AM

## 2017-03-14 DIAGNOSIS — Z7901 Long term (current) use of anticoagulants: Secondary | ICD-10-CM | POA: Diagnosis not present

## 2017-03-14 DIAGNOSIS — I509 Heart failure, unspecified: Secondary | ICD-10-CM | POA: Diagnosis not present

## 2017-03-14 DIAGNOSIS — R410 Disorientation, unspecified: Secondary | ICD-10-CM | POA: Diagnosis not present

## 2017-03-14 DIAGNOSIS — Z96649 Presence of unspecified artificial hip joint: Secondary | ICD-10-CM

## 2017-03-14 DIAGNOSIS — R278 Other lack of coordination: Secondary | ICD-10-CM | POA: Diagnosis not present

## 2017-03-14 DIAGNOSIS — M1000 Idiopathic gout, unspecified site: Secondary | ICD-10-CM | POA: Diagnosis not present

## 2017-03-14 DIAGNOSIS — R2689 Other abnormalities of gait and mobility: Secondary | ICD-10-CM | POA: Diagnosis not present

## 2017-03-14 DIAGNOSIS — J410 Simple chronic bronchitis: Secondary | ICD-10-CM | POA: Diagnosis not present

## 2017-03-14 DIAGNOSIS — Z79899 Other long term (current) drug therapy: Secondary | ICD-10-CM | POA: Diagnosis not present

## 2017-03-14 DIAGNOSIS — E440 Moderate protein-calorie malnutrition: Secondary | ICD-10-CM

## 2017-03-14 DIAGNOSIS — S72001A Fracture of unspecified part of neck of right femur, initial encounter for closed fracture: Secondary | ICD-10-CM | POA: Diagnosis not present

## 2017-03-14 DIAGNOSIS — J449 Chronic obstructive pulmonary disease, unspecified: Secondary | ICD-10-CM | POA: Diagnosis not present

## 2017-03-14 DIAGNOSIS — S7291XD Unspecified fracture of right femur, subsequent encounter for closed fracture with routine healing: Secondary | ICD-10-CM | POA: Diagnosis not present

## 2017-03-14 DIAGNOSIS — I4891 Unspecified atrial fibrillation: Secondary | ICD-10-CM | POA: Diagnosis not present

## 2017-03-14 DIAGNOSIS — R2681 Unsteadiness on feet: Secondary | ICD-10-CM | POA: Diagnosis not present

## 2017-03-14 DIAGNOSIS — M6281 Muscle weakness (generalized): Secondary | ICD-10-CM | POA: Diagnosis not present

## 2017-03-14 DIAGNOSIS — N189 Chronic kidney disease, unspecified: Secondary | ICD-10-CM | POA: Diagnosis not present

## 2017-03-14 LAB — PROTIME-INR
INR: 1.92
PROTHROMBIN TIME: 21.8 s — AB (ref 11.4–15.2)

## 2017-03-14 MED ORDER — WARFARIN SODIUM 2.5 MG PO TABS
2.5000 mg | ORAL_TABLET | Freq: Once | ORAL | Status: DC
  Filled 2017-03-14: qty 1

## 2017-03-14 NOTE — Social Work (Signed)
CSW received a call from [place] at [facility] indicating that they have received Insurance Auth for placement.  CSW met with son at bedside and advised of same.   CSW wil f/u for disposition.  Elissa Hefty, LCSW Clinical Social Worker [phone]

## 2017-03-14 NOTE — Discharge Summary (Signed)
Physician Discharge Summary  [name]:[id number] DOB: [birth year] DOA: 03/10/2017  PCP: No primary care provider on file.  Admit date: 03/10/2017 Discharge date: 03/14/2017  Admitted From: Home Disposition: SNF   Recommendations for Outpatient Follow-up:  1. Follow up with orthopedics in 2 weeks. 2. Monitor INR, continue coumadin 2.5mg  (start this dose 2/18) alternating with 1.25mg  daily.   Home Health: N/A Equipment/Devices: Per SNF Discharge Condition: Stable CODE STATUS: Full Diet recommendation: Heart healthy  Brief/Interim Summary: Wesley Baker is an 84 y.o. male with a history of AFib on coumadin, chronic CHF, COPD, HTN who presented after a mechanical fall with right hip fracture. Orthopedic surgery was consulted and the patient had arthroplasty 2/14 by Dr. Fredonia Highland. Vitamin K was given to reverse coumadin, which was resumed postoperatively with lovenox bridging while awaiting insurance authorization for SNF placement as recommended by PT.  Discharge Diagnoses:  Principal Problem:   Closed right hip fracture, initial encounter [facility]) Active Problems:   Unspecified atrial fibrillation (HCC)   COPD (chronic obstructive pulmonary disease) (HCC)   Gout   Malnutrition of moderate degree  Closed right hip fracture: s/p arthroplasty bipolar hip 2/14.  - WBAT, posterior hip precautions - Dry dressing prn - SNF disposition per PT/OT recommendations - Post op DVT ppx will be chronic coumadin.  - Pain control per orthopedics.   AFib: rate controlled. - Continue metoprolol. - Was bridged with lovenox while inpatient. Will continue previous home dosing at discharge (2.5mg  / 1.25mg  alternating) which has been stable for years. Monitor INR this week.  HTN:  - PRN IV hydralazine  COPD: No wheezing/exacerbation.  - Continue DuoNebs scheduled, prn and ICS.   Chronic kidney disease stage II-III: Not definitely sure of baseline Cr.  - Monitor BMP at follow  up.  Chronic CHF: Euvolemic. No echo report available, so systolic/diastolic designation is unknown. - Continue home medications.   Gout:  - Continue allopurinol.   Acute blood loss anemia: Mild. Hgb 12, normocytic. - Recheck at follow up  Moderate malnutrition:  - Supplements as ordered, RD was consulted.  Discharge Instructions Discharge Instructions    Diet - low sodium heart healthy   Complete by:  As directed    Increase activity slowly   Complete by:  As directed      Allergies as of 03/14/2017   No Known Allergies     Medication List    TAKE these medications   allopurinol 300 MG tablet Commonly known as:  ZYLOPRIM Take 300 mg by mouth daily.   budesonide-formoterol 160-4.5 MCG/ACT inhaler Commonly known as:  SYMBICORT Inhale 2 puffs into the lungs 2 (two) times daily.   furosemide 40 MG tablet Commonly known as:  LASIX Take 40 mg by mouth 2 (two) times daily.   gabapentin 600 MG tablet Commonly known as:  NEURONTIN Take 600 mg by mouth 3 (three) times daily.   HYDROcodone-acetaminophen 5-325 MG tablet Commonly known as:  NORCO Take 1-2 tablets by mouth every 6 (six) hours as needed for moderate pain.   ipratropium 0.06 % nasal spray Commonly known as:  ATROVENT Place 1 spray into the nose 2 (two) times daily.   ipratropium-albuterol 0.5-2.5 (3) MG/3ML Soln Commonly known as:  DUONEB Inhale 3 mLs into the lungs 2 (two) times daily.   metoprolol succinate 100 MG 24 hr tablet Commonly known as:  TOPROL-XL Take 150 mg by mouth daily.   warfarin 2.5 MG tablet Commonly known as:  COUMADIN Take 2.5 mg by  mouth at bedtime.      Follow-up Information    Renette Butters, MD Follow up in 2 week(s).   Specialty:  Orthopedic Surgery Contact information: [address] [address] [phone]          No Known Allergies  Consultations: Orthopedics, Dr. Fredonia Highland 03/10/2017: ARTHROPLASTY BIPOLAR HIP  (HEMIARTHROPLASTY)  Procedures/Studies: Pelvis Portable  Result Date: 03/10/2017 CLINICAL DATA:  Post right hip replacement. EXAM: PORTABLE PELVIS 1-2 VIEWS COMPARISON:  03/09/2017 FINDINGS: There has been a total right hip arthroplasty with removal of the fractured right femoral head and neck. The alignment is anatomic. No evidence of immediate complications. Heavy calcific atherosclerotic disease noted. IMPRESSION: Post total right hip arthroplasty without evidence of immediate complications. Electronically Signed   By: Fidela Salisbury M.D.   On: 03/10/2017 17:30   Subjective: Breathing well, pain controlled, no bleeding.   Discharge Exam: Vitals:   03/13/17 1925 03/14/17 0417  BP: (!) 148/62 (!) 128/55  Pulse: 89 75  Resp: 18 16  Temp: 98 F (36.7 C) 97.9 F (36.6 C)  SpO2: 96% 96%   General: Elderly male in no distress Cardiovascular: RRR, S1/S2 +, no rubs, no gallops Respiratory: CTA bilaterally, no wheezing, no rhonchi Abdominal: Soft, NT, ND, bowel sounds + Extremities: No edema. Compartments soft, SILT, motor function is preserved and cap refill brisk.  Skin: Incision dressings c/d/i.    Labs: Basic Metabolic Panel: Recent Labs  Lab 03/10/17 0412 03/11/17 0814  NA 139 138  K 3.9 3.9  CL 96* 100*  CO2 29 27  GLUCOSE 137* 143*  BUN 24* 32*  CREATININE 1.30* 1.19  CALCIUM 8.8* 8.9   Liver Function Tests: Recent Labs  Lab 03/10/17 0412 03/11/17 0814  AST 27 36  ALT 15* 12*  ALKPHOS 94 83  BILITOT 2.8* 2.4*  PROT 6.6 6.1*  ALBUMIN 3.6 3.2*   CBC: Recent Labs  Lab 03/10/17 0412 03/11/17 0706 03/11/17 0814 03/12/17 0442  WBC 10.3 10.2 10.2 8.3  NEUTROABS 9.4*  --   --   --   HGB 13.8 12.4* 13.0 12.0*  HCT 41.6 39.1 40.5 37.2*  MCV 92.2 92.9 92.3 92.5  PLT 196 184 195 180    Time coordinating discharge: Approximately 40 minutes  Vance Gather, MD  [facility] 03/14/2017, 10:32 AM Pager [phone]

## 2017-03-14 NOTE — Social Work (Signed)
Clinical Social Worker facilitated patient discharge including contacting patient family and facility to confirm patient discharge plans.  Clinical information faxed to facility and family agreeable with plan.    CSW arranged ambulance transport via [facility] to [place] at 1:00pm.    RN to call [phone] to give report prior to discharge.  Clinical Social Worker will sign off for now as social work intervention is no longer needed. Please consult [place] again if new need arises.  Elissa Hefty, LCSW Clinical Social Worker [phone]

## 2017-03-14 NOTE — Progress Notes (Signed)
ANTICOAGULATION CONSULT NOTE - Follow Up Consult  Pharmacy Consult:  Coumadin Indication: atrial fibrillation  No Known Allergies  Patient Measurements: Height: 5\' 10"  (177.8 cm) Weight: 182 lb (82.6 kg) IBW/kg (Calculated) : 73  Vital Signs: Temp: 97.9 F (36.6 C) (02/18 0417) Temp Source: Oral (02/18 0417) BP: 128/55 (02/18 0417) Pulse Rate: 75 (02/18 0417)  Labs: Recent Labs    03/12/17 0442 03/13/17 0547 03/14/17 0350  HGB 12.0*  --   --   HCT 37.2*  --   --   PLT 180  --   --   LABPROT 17.7* 21.0* 21.8*  INR 1.47 1.83 1.92    Estimated Creatinine Clearance: 43.5 mL/min (by C-G formula based on SCr of 1.19 mg/dL).    Assessment: Wesley Baker on Coumadin PTA for history of atrial fibrillation. Coumadin was initially held and patient received vitamin K 5mg  IV x1 on 2/14 in order to reverse INR for hip fracture repair on 2/14. Coumadin was resumed post-operatively.  Prophylactic Lovenox also started.  INR trending up toward goal; no bleeding reported.  Home regimen was 2.5mg  and 1.25mg  alternating every other day. Last dose was on 2/11 and took a 2.5mg  dose that day.    Goal of Therapy:  INR 2-3 Monitor platelets by anticoagulation protocol: Yes    Plan:  Coumadin 2.5mg  PO today Lovenox 40mg  SQ Q24H until INR therapeutic Daily PT / INR   Taliana Mersereau D. Mina Marble, PharmD, BCPS Pager:  [phone] 03/14/2017, 8:55 AM

## 2017-03-14 NOTE — Progress Notes (Signed)
Reported to RN at [facility]

## 2017-03-14 NOTE — Clinical Social Work Placement (Signed)
   CLINICAL SOCIAL WORK PLACEMENT  NOTE  Date:  03/14/2017  Patient Details  Name: Wesley Baker MRN: [id number] Date of Birth: July 09, 1925  Clinical Social Work is seeking post-discharge placement for this patient at the [place] level of care (*CSW will initial, date and re-position this form in  chart as items are completed):  Yes   Patient/family provided with [place] Work Department's list of facilities offering this level of care within the geographic area requested by the patient (or if unable, by the patient's family).  Yes   Patient/family informed of their freedom to choose among providers that offer the needed level of care, that participate in Medicare, Medicaid or managed care program needed by the patient, have an available bed and are willing to accept the patient.      Patient/family informed of [place]'s ownership interest in [facility] and [facility], as well as of the fact that they are under no obligation to receive care at these facilities.  PASRR submitted to EDS on       PASRR number received on       Existing PASRR number confirmed on 03/11/17     FL2 transmitted to all facilities in geographic area requested by pt/family on       FL2 transmitted to all facilities within larger geographic area on 03/11/17     Patient informed that his/her managed care company has contracts with or will negotiate with certain facilities, including the following:        Yes   Patient/family informed of bed offers received.  Patient chooses bed at [facility]     Physician recommends and patient chooses bed at      Patient to be transferred to [facility] on 03/14/17.  Patient to be transferred to facility by [facility]     Patient family notified on 03/14/17 of transfer.  Name of family member notified:  called Lorel Monaco     PHYSICIAN       Additional Comment:     _______________________________________________ [name], LCSW 03/14/2017, 10:51 AM

## 2017-03-14 NOTE — Progress Notes (Signed)
Physical Therapy Treatment Patient Details Name: Wesley Baker MRN: [id number] DOB: [birth year] Today's Date: 03/14/2017    History of Present Illness 84 y.o. male s/p R Hip Hemiarthroplasty 2/15. PMH includes: COPD, Dysrhythmia, Gout, HTN, Back Surgery, knee surgery, prostate surgery.     PT Comments    Pt is progressing well with mobility and was able to walk into the hallway with one person assist and second person following with chair for safety.  He will do well at rehab and I do anticipate he will progress back to home with RW use full time now. Son was present and very supportive.  Pt is scheduled to d/c to SNF this afternoon.  PT to follow acutely until d/c confirmed.      Follow Up Recommendations  SNF;Supervision/Assistance - 24 hour     Equipment Recommendations  3in1 (PT)    Recommendations for Other Services   NA     Precautions / Restrictions Precautions Precautions: Fall;Posterior Hip Precaution Comments: reviewed posterior hip precautions with pt and his son.  Restrictions Weight Bearing Restrictions: Yes RLE Weight Bearing: Weight bearing as tolerated    Mobility  Bed Mobility                  Transfers Overall transfer level: Needs assistance Equipment used: Rolling walker (2 wheeled) Transfers: Sit to/from Stand Sit to Stand: Min assist         General transfer comment: Min assit from commode and lower recliner chair.  Cues for safe transitions and hand placement.  Min assist to power up and control descent down.   Ambulation/Gait Ambulation/Gait assistance: Min assist;+2 safety/equipment Ambulation Distance (Feet): 55 Feet(15'x1, 55'x1) Assistive device: Rolling walker (2 wheeled) Gait Pattern/deviations: Step-through pattern;Antalgic Gait velocity: decreased Gait velocity interpretation: Below normal speed for age/gender General Gait Details: Pt with moderately antalgic gait pattern, verbal cues for upright posture.  Chair followed to  encourage increased mobility and safety during attempts at hallway amublation.           Balance Overall balance assessment: Needs assistance Sitting-balance support: Feet supported;No upper extremity supported Sitting balance-Leahy Scale: Good     Standing balance support: Bilateral upper extremity supported Standing balance-Leahy Scale: Poor                              Cognition Arousal/Alertness: Awake/alert Behavior During Therapy: WFL for tasks assessed/performed Overall Cognitive Status: History of cognitive impairments - at baseline                                 General Comments: Per son, he is close to usual.  He is [facility] which doesn't help      Exercises Total Joint Exercises Ankle Circles/Pumps: AROM;Both;20 reps Quad Sets: AROM;Both;10 reps Short Arc Quad: AROM;Right;10 reps Heel Slides: AAROM;Both;10 reps Hip ABduction/ADduction: AROM;AAROM;Left;Right;10 reps Long Arc Quad: AROM;Right;10 reps        Pertinent Vitals/Pain Pain Assessment: Faces Faces Pain Scale: Hurts little more Pain Location: R hip Pain Descriptors / Indicators: Discomfort;Aching Pain Intervention(s): Limited activity within patient's tolerance;Monitored during session;Repositioned           PT Goals (current goals can now be found in the care plan section) Acute Rehab PT Goals Patient Stated Goal: home Progress towards PT goals: Progressing toward goals    Frequency    Min 3X/week  PT Plan Current plan remains appropriate       AM-PAC PT "6 Clicks" Daily Activity  Outcome Measure  Difficulty turning over in bed (including adjusting bedclothes, sheets and blankets)?: Unable Difficulty moving from lying on back to sitting on the side of the bed? : Unable Difficulty sitting down on and standing up from a chair with arms (e.g., wheelchair, bedside commode, etc,.)?: Unable Help needed moving to and from a bed to chair (including a  wheelchair)?: A Little Help needed walking in hospital room?: A Little Help needed climbing 3-5 steps with a railing? : A Lot 6 Click Score: 11    End of Session   Activity Tolerance: Patient tolerated treatment well Patient left: in chair;with call bell/phone within reach;with family/visitor present   PT Visit Diagnosis: Unsteadiness on feet (R26.81);Other abnormalities of gait and mobility (R26.89);Muscle weakness (generalized) (M62.81);Pain Pain - Right/Left: Right Pain - part of body: Hip     Time: 1301-1320 PT Time Calculation (min) (ACUTE ONLY): 19 min  Charges:  $Gait Training: 8-22 mins          Rush Salce B. Nikisha Fleece, [place], DPT [phone]            03/14/2017, 1:58 PM

## 2017-03-14 NOTE — Care Management Important Message (Signed)
Important Message  Patient Details  Name: Wesley Baker MRN: [id number] Date of Birth: 1925-11-05   Medicare Important Message Given:  Yes    [name] [name] 03/14/2017, 3:47 PM

## 2017-03-15 ENCOUNTER — Encounter (HOSPITAL_COMMUNITY): Payer: Self-pay | Admitting: Orthopedic Surgery

## 2017-03-15 DIAGNOSIS — Z7901 Long term (current) use of anticoagulants: Secondary | ICD-10-CM | POA: Diagnosis not present

## 2017-03-22 ENCOUNTER — Other Ambulatory Visit: Payer: Self-pay | Admitting: *Deleted

## 2017-03-22 NOTE — Patient Outreach (Signed)
[facility] [facility]) Care Management  03/22/2017  Wesley Baker [birth year] [id number]  Received referral via [place]; member discharged from inpatient admission from [facility] 03/14/2017;  Per chart review: Admission: 2/14-2/18/2019 Dx: Closed hip fracture Surgery-Hemi-arthroplasty Discharged to SNF-[facility] coordination call to [employer]; spoke with person in admissions; HIPPa verification received. Advised that patient was patient in facility receiving rehabilitation therapy. Can contact Social Worker -Engineer, maintenance for further information-[phone].  Plan: Refer to care management assistant to assign Clinical Social Worker. Sherrin Daisy, RN BSN [place] Management Coordinator [facility] Care Management  [phone]

## 2017-03-23 ENCOUNTER — Other Ambulatory Visit: Payer: Self-pay | Admitting: *Deleted

## 2017-03-23 NOTE — Patient Outreach (Signed)
[name]) Care Management  03/23/2017  Wesley Baker [birth year] [id number]    CSW was able to meet with patient at [facility].  CSW introduced self, explained role and types of services provided through [name] ([place] [facility]).   CSW obtained two HIPAA compliant identifiers from patient, which included patient's address and date of birth.  Patient's daughter, Wesley Baker also at SNF and  at bedside. CSW provided [facility] Welcome Packet to patient and he and daughter prefer that the son who lives with patient review and determine their interest in participating.   Patient reports falling, "tripping over his dog" and breaking his hip. Prior to the fall,he was quite independent at home; living with his son but able to care for self.  "He still has his drivers license but he doesn't drive" per the daughter.  "I am going home Saturday"; both patient and daughter indicate he will return home with son and tomorrow they plan to "practice getting in car".   CSW will plan to contact SNF rep to get specifics on dc plans and will plan f/u call to patient at home next week.    THN CM Care Plan Problem One     Most Recent Value  Care Plan Problem One  Patient at SNF s/p right hip repair.  Role Documenting the Problem One  Clinical Social Worker  Care Plan for Problem One  Active  [facility] Long Term Goal   Patient/family will verbalize having all DME in home prior to SNF dc within the next 31 days.  THN Long Term Goal Start Date  03/23/17  Interventions for Problem One Long Term Goal  CSW discussed current DME needs.CSW encouraged family to discuss with PT and OT at SNF about ordering all needed DME. CSW reviewed with patient and family all current DME and emphasized the importance of current DME being in safe working order. Discussed ramp.   THN CM Short Term Goal #1   Patient will participate in scheduled therapy sessions daily while  at SNF.  THN CM Short Term Goal #1 Start Date  03/23/17  Interventions for Short Term Goal #1  CSW discussed importance of attending therapies for strengthening. safety and transitioning to home.   THN CM Short Term Goal #2   Patient/family will review [facility] Welcome Packet within the next 7 days.   THN CM Short Term Goal #2 Start Date  03/23/17  Interventions for Short Term Goal #2  CSW provided packet and contact information for any questions. CSW discussed [facility] services.      Eduard Clos, MSW, [place] Worker  [place] [phone]

## 2017-03-24 ENCOUNTER — Other Ambulatory Visit: Payer: Self-pay | Admitting: *Deleted

## 2017-03-24 NOTE — Patient Outreach (Signed)
[facility]) Care Management  03/24/2017  Wesley Baker 09-10-1925 [id number]   CSW made contact with [name], SNF rep for discharge planning update who confirms plans for dc to home on Saturday. [facility] will be arranged to provide PT and OT and no DME needs per SNF rep.   CSW plans f/u call to patient at home next week for post SNF needs and further planning.   Eduard Clos, MSW, [place] Worker  [place] [phone]

## 2017-03-27 DIAGNOSIS — I509 Heart failure, unspecified: Secondary | ICD-10-CM | POA: Diagnosis not present

## 2017-03-27 DIAGNOSIS — I130 Hypertensive heart and chronic kidney disease with heart failure and stage 1 through stage 4 chronic kidney disease, or unspecified chronic kidney disease: Secondary | ICD-10-CM | POA: Diagnosis not present

## 2017-03-27 DIAGNOSIS — D631 Anemia in chronic kidney disease: Secondary | ICD-10-CM | POA: Diagnosis not present

## 2017-03-27 DIAGNOSIS — N183 Chronic kidney disease, stage 3 (moderate): Secondary | ICD-10-CM | POA: Diagnosis not present

## 2017-03-27 DIAGNOSIS — J449 Chronic obstructive pulmonary disease, unspecified: Secondary | ICD-10-CM | POA: Diagnosis not present

## 2017-03-27 DIAGNOSIS — S72001D Fracture of unspecified part of neck of right femur, subsequent encounter for closed fracture with routine healing: Secondary | ICD-10-CM | POA: Diagnosis not present

## 2017-03-28 ENCOUNTER — Other Ambulatory Visit: Payer: Self-pay | Admitting: *Deleted

## 2017-03-28 DIAGNOSIS — R262 Difficulty in walking, not elsewhere classified: Secondary | ICD-10-CM | POA: Diagnosis not present

## 2017-03-28 DIAGNOSIS — N183 Chronic kidney disease, stage 3 (moderate): Secondary | ICD-10-CM | POA: Diagnosis not present

## 2017-03-28 DIAGNOSIS — Z7901 Long term (current) use of anticoagulants: Secondary | ICD-10-CM | POA: Diagnosis not present

## 2017-03-28 DIAGNOSIS — S72001D Fracture of unspecified part of neck of right femur, subsequent encounter for closed fracture with routine healing: Secondary | ICD-10-CM | POA: Diagnosis not present

## 2017-03-28 DIAGNOSIS — I509 Heart failure, unspecified: Secondary | ICD-10-CM | POA: Diagnosis not present

## 2017-03-28 DIAGNOSIS — D631 Anemia in chronic kidney disease: Secondary | ICD-10-CM | POA: Diagnosis not present

## 2017-03-28 DIAGNOSIS — I4891 Unspecified atrial fibrillation: Secondary | ICD-10-CM | POA: Diagnosis not present

## 2017-03-28 DIAGNOSIS — J449 Chronic obstructive pulmonary disease, unspecified: Secondary | ICD-10-CM | POA: Diagnosis not present

## 2017-03-28 DIAGNOSIS — I130 Hypertensive heart and chronic kidney disease with heart failure and stage 1 through stage 4 chronic kidney disease, or unspecified chronic kidney disease: Secondary | ICD-10-CM | POA: Diagnosis not present

## 2017-03-28 DIAGNOSIS — Z5181 Encounter for therapeutic drug level monitoring: Secondary | ICD-10-CM | POA: Diagnosis not present

## 2017-03-29 DIAGNOSIS — I509 Heart failure, unspecified: Secondary | ICD-10-CM | POA: Diagnosis not present

## 2017-03-29 DIAGNOSIS — J449 Chronic obstructive pulmonary disease, unspecified: Secondary | ICD-10-CM | POA: Diagnosis not present

## 2017-03-29 DIAGNOSIS — I130 Hypertensive heart and chronic kidney disease with heart failure and stage 1 through stage 4 chronic kidney disease, or unspecified chronic kidney disease: Secondary | ICD-10-CM | POA: Diagnosis not present

## 2017-03-29 DIAGNOSIS — N183 Chronic kidney disease, stage 3 (moderate): Secondary | ICD-10-CM | POA: Diagnosis not present

## 2017-03-29 DIAGNOSIS — D631 Anemia in chronic kidney disease: Secondary | ICD-10-CM | POA: Diagnosis not present

## 2017-03-29 DIAGNOSIS — S72001D Fracture of unspecified part of neck of right femur, subsequent encounter for closed fracture with routine healing: Secondary | ICD-10-CM | POA: Diagnosis not present

## 2017-03-29 NOTE — Patient Outreach (Signed)
[place] [facility]) Care Management  03/29/2017  Wesley Baker 01-21-1926 [id number]   CSW contacted patient at home and spoke with his son, [name], who reports pt was released from SNF over the weekend to home. HH and DME have been arranged and they are expecting a visit from [facility] today.  Per son, there are no needs or concerns for CSW support at this time.  CSW will advise [facility] RNCM of plans to close CSW referral at this time. Son advised of plans for CSW to close CSW referral and to expect a call from [place] for post SNF followup.   THN CM Care Plan Problem One     Most Recent Value  Care Plan Problem One  Patient at SNF s/p right hip repair.  Role Documenting the Problem One  Clinical Social Worker  Care Plan for Problem One  Active  [facility] Long Term Goal   Patient/family will verbalize having all DME in home prior to SNF dc within the next 31 days.  THN Long Term Goal Start Date  03/23/17  [facility] Long Term Goal Met Date  03/29/17  Interventions for Problem One Long Term Goal  CSW discussed current DME needs.CSW encouraged family to discuss with PT and OT at SNF about ordering all needed DME. CSW reviewed with patient and family all current DME and emphasized the importance of current DME being in safe working order. Discussed ramp.   THN CM Short Term Goal #1   Patient will participate in scheduled therapy sessions daily while at SNF.  THN CM Short Term Goal #1 Start Date  03/23/17  [facility] CM Short Term Goal #1 Met Date  03/29/17  Interventions for Short Term Goal #1  CSW discussed importance of attending therapies for strengthening. safety and transitioning to home.   THN CM Short Term Goal #2   Patient/family will review [facility] Welcome Packet within the next 7 days.   THN CM Short Term Goal #2 Start Date  03/23/17  [place] CM Short Term Goal #2 Met Date  03/29/17  Interventions for Short Term Goal #2  CSW provided packet and contact information for any questions. CSW discussed [facility] services.        Eduard Clos, MSW, [place] Worker  [place] [phone]

## 2017-03-30 ENCOUNTER — Encounter: Payer: Self-pay | Admitting: *Deleted

## 2017-03-30 ENCOUNTER — Other Ambulatory Visit: Payer: Self-pay | Admitting: *Deleted

## 2017-03-30 NOTE — Patient Outreach (Signed)
[place] [facility]) Care Management  03/30/2017  JAD JOHANSSON July 14, 1925 [id number]   Transition of care call  Referral reason : [facility]charge [facility], rehab Referral diagnosis: Right hip hemiarthroplasty    Successful telephone outreach to patient, explained reason for the call, and [facility] care management services. HIPAA information verified.   Patient discussed fall at home after tripping over a rug, and experiencing a right hip fracture. Patient reports being independent at home prior to this. Patient reports he is progressing well since being home from rehab, he reports [facility] home health services is following him, physical therapy has visited once this week and next visit on tomorrow. Patient discussed he is using a walker and rugs have been removed. Patient states ramp has been built at his home and plans for shower with grab bars to be installed on this weekend.  Patient discussed other chronic conditions of COPD , Atrial fib.  Patient reports pain controlled using tylenol once at night, he denies shortness of breath.  Patient lives at home, and son live with him, they are currently using a 2 way radio when son is out working on the farm.  Patient requested that I speak with his son to review medications, Son Broderick Fonseca reports he manages medication using a weekly pill organizer. Patient was recently discharged from hospital and all medications have been reviewed. Patient denies cost concerns of purchasing medications, patient get coumadin lab monthly at PCP office.   Patient has attended PCP post discharge office visit on 3/4, and has office visit with orthopedic MD on 3/8, son will provide transportation .   Patient is eligible for [facility] complex care management services and is agreeable.   Outpatient Encounter Medications as of 03/30/2017  Medication Sig  . allopurinol (ZYLOPRIM) 300 MG tablet Take 300 mg by mouth daily.  . budesonide-formoterol (SYMBICORT)  160-4.5 MCG/ACT inhaler Inhale 2 puffs into the lungs 2 (two) times daily.  . furosemide (LASIX) 40 MG tablet Take 40 mg by mouth 2 (two) times daily.  Marland Kitchen gabapentin (NEURONTIN) 600 MG tablet Take 600 mg by mouth 3 (three) times daily.  Marland Kitchen ipratropium (ATROVENT) 0.06 % nasal spray Place 1 spray into the nose 2 (two) times daily.  Marland Kitchen ipratropium-albuterol (DUONEB) 0.5-2.5 (3) MG/3ML SOLN Inhale 3 mLs into the lungs 2 (two) times daily.  . metoprolol succinate (TOPROL-XL) 100 MG 24 hr tablet Take 150 mg by mouth daily.  Marland Kitchen HYDROcodone-acetaminophen (NORCO) 5-325 MG tablet Take 1-2 tablets by mouth every 6 (six) hours as needed for moderate pain. (Patient not taking: Reported on 03/30/2017)  . warfarin (COUMADIN) 2.5 MG tablet Take 2.5 mg by mouth at bedtime.   No facility-administered encounter medications on file as of 03/30/2017.       Plan  Will follow patient for transition of care , weekly outreaches, next call in a week.  Will send patient welcome letter RNCM will send PCP barrier involvement letter.     THN CM Care Plan Problem One     Most Recent Value  Care Plan Problem One  Risk for readmission related to recent hospital discharge and SNF for rehab,after right hip surgery.    Role Documenting the Problem One  Care Management [place] for Problem One  Active  THN Long Term Goal   Patient will report no hospital redmission in the next 60 days   THN Long Term Goal Start Date  03/30/17  Interventions for Problem One Long Term Goal  RNCM advised regarding taking medications as precribed, keeping all MD appointments   THN CM Short Term Goal #1   Patient will report no falls in the next 30 days   THN CM Short Term Goal #1 Start Date  03/30/17  Interventions for Short Term Goal #1  RN discussed fall prevention measures, of using walker at all times.   THN CM Short Term Goal #2   Patient will report increased strenght and balance in the next 30 days   THN CM Short Term Goal #2  Start Date  03/30/17  Interventions for Short Term Goal #2  RN discussed importance of participation in home health therapy, and exercises.        Joylene Draft, RN, [place] Management Coordinator  [phone]- Mobile [phone]- Toll Free Main Office

## 2017-03-31 DIAGNOSIS — I509 Heart failure, unspecified: Secondary | ICD-10-CM | POA: Diagnosis not present

## 2017-03-31 DIAGNOSIS — S72001D Fracture of unspecified part of neck of right femur, subsequent encounter for closed fracture with routine healing: Secondary | ICD-10-CM | POA: Diagnosis not present

## 2017-03-31 DIAGNOSIS — N183 Chronic kidney disease, stage 3 (moderate): Secondary | ICD-10-CM | POA: Diagnosis not present

## 2017-03-31 DIAGNOSIS — J449 Chronic obstructive pulmonary disease, unspecified: Secondary | ICD-10-CM | POA: Diagnosis not present

## 2017-03-31 DIAGNOSIS — I130 Hypertensive heart and chronic kidney disease with heart failure and stage 1 through stage 4 chronic kidney disease, or unspecified chronic kidney disease: Secondary | ICD-10-CM | POA: Diagnosis not present

## 2017-03-31 DIAGNOSIS — D631 Anemia in chronic kidney disease: Secondary | ICD-10-CM | POA: Diagnosis not present

## 2017-04-01 DIAGNOSIS — S72001D Fracture of unspecified part of neck of right femur, subsequent encounter for closed fracture with routine healing: Secondary | ICD-10-CM | POA: Diagnosis not present

## 2017-04-04 DIAGNOSIS — Z5181 Encounter for therapeutic drug level monitoring: Secondary | ICD-10-CM | POA: Diagnosis not present

## 2017-04-04 DIAGNOSIS — I4891 Unspecified atrial fibrillation: Secondary | ICD-10-CM | POA: Diagnosis not present

## 2017-04-04 DIAGNOSIS — I130 Hypertensive heart and chronic kidney disease with heart failure and stage 1 through stage 4 chronic kidney disease, or unspecified chronic kidney disease: Secondary | ICD-10-CM | POA: Diagnosis not present

## 2017-04-04 DIAGNOSIS — Z7901 Long term (current) use of anticoagulants: Secondary | ICD-10-CM | POA: Diagnosis not present

## 2017-04-04 DIAGNOSIS — R251 Tremor, unspecified: Secondary | ICD-10-CM | POA: Diagnosis not present

## 2017-04-04 DIAGNOSIS — S72001D Fracture of unspecified part of neck of right femur, subsequent encounter for closed fracture with routine healing: Secondary | ICD-10-CM | POA: Diagnosis not present

## 2017-04-04 DIAGNOSIS — D631 Anemia in chronic kidney disease: Secondary | ICD-10-CM | POA: Diagnosis not present

## 2017-04-04 DIAGNOSIS — I509 Heart failure, unspecified: Secondary | ICD-10-CM | POA: Diagnosis not present

## 2017-04-04 DIAGNOSIS — J449 Chronic obstructive pulmonary disease, unspecified: Secondary | ICD-10-CM | POA: Diagnosis not present

## 2017-04-04 DIAGNOSIS — N183 Chronic kidney disease, stage 3 (moderate): Secondary | ICD-10-CM | POA: Diagnosis not present

## 2017-04-05 DIAGNOSIS — D631 Anemia in chronic kidney disease: Secondary | ICD-10-CM | POA: Diagnosis not present

## 2017-04-05 DIAGNOSIS — I130 Hypertensive heart and chronic kidney disease with heart failure and stage 1 through stage 4 chronic kidney disease, or unspecified chronic kidney disease: Secondary | ICD-10-CM | POA: Diagnosis not present

## 2017-04-05 DIAGNOSIS — N183 Chronic kidney disease, stage 3 (moderate): Secondary | ICD-10-CM | POA: Diagnosis not present

## 2017-04-05 DIAGNOSIS — I509 Heart failure, unspecified: Secondary | ICD-10-CM | POA: Diagnosis not present

## 2017-04-05 DIAGNOSIS — S72001D Fracture of unspecified part of neck of right femur, subsequent encounter for closed fracture with routine healing: Secondary | ICD-10-CM | POA: Diagnosis not present

## 2017-04-05 DIAGNOSIS — J449 Chronic obstructive pulmonary disease, unspecified: Secondary | ICD-10-CM | POA: Diagnosis not present

## 2017-04-06 ENCOUNTER — Other Ambulatory Visit: Payer: Self-pay | Admitting: *Deleted

## 2017-04-06 DIAGNOSIS — I509 Heart failure, unspecified: Secondary | ICD-10-CM | POA: Diagnosis not present

## 2017-04-06 DIAGNOSIS — I130 Hypertensive heart and chronic kidney disease with heart failure and stage 1 through stage 4 chronic kidney disease, or unspecified chronic kidney disease: Secondary | ICD-10-CM | POA: Diagnosis not present

## 2017-04-06 DIAGNOSIS — S72001D Fracture of unspecified part of neck of right femur, subsequent encounter for closed fracture with routine healing: Secondary | ICD-10-CM | POA: Diagnosis not present

## 2017-04-06 DIAGNOSIS — N183 Chronic kidney disease, stage 3 (moderate): Secondary | ICD-10-CM | POA: Diagnosis not present

## 2017-04-06 DIAGNOSIS — J449 Chronic obstructive pulmonary disease, unspecified: Secondary | ICD-10-CM | POA: Diagnosis not present

## 2017-04-06 DIAGNOSIS — D631 Anemia in chronic kidney disease: Secondary | ICD-10-CM | POA: Diagnosis not present

## 2017-04-06 NOTE — Patient Outreach (Signed)
[place] [facility]) Care Management  04/06/2017  Wesley Baker May 02, 1925 [id number]  Transition of care call   Per chart review: Admission: [facility]/14[facility]2/18/[facility]  Dx: Closed hip fracture Surgery-Hemi-arthroplasty Discharged to SNF-[facility] 3/18 Discharged from [facility] 3/2   Successful outreach call to patient, HIPAA verified . Patient reports that he is doing pretty good, just finished breakfast.  Patient discussed he continues to make good progress with therapy, has planned visits with occupation and physical therapy on today.  Patient discussed his son has removed all the throw rug in the home, to prevent incident.   Patient reports pain is tolerable he occasionally takes tylenol as needed.   Discussed with patient call placed to [place] and anticipate frozen meal delivery on 3/14.   Patient denies any new concerns at this time, continues to take his medications daily as prescribed, his son is available to assist with daily cares as needed.  Patient agreeable to scheduling home visit.   Plan  RNCM will schedule initial transition of care home visit in the next week.    Joylene Draft, RN, [place] Management Coordinator  [phone]- Mobile [phone]- Toll Free Main Office

## 2017-04-07 DIAGNOSIS — I509 Heart failure, unspecified: Secondary | ICD-10-CM | POA: Diagnosis not present

## 2017-04-07 DIAGNOSIS — S72001D Fracture of unspecified part of neck of right femur, subsequent encounter for closed fracture with routine healing: Secondary | ICD-10-CM | POA: Diagnosis not present

## 2017-04-07 DIAGNOSIS — I130 Hypertensive heart and chronic kidney disease with heart failure and stage 1 through stage 4 chronic kidney disease, or unspecified chronic kidney disease: Secondary | ICD-10-CM | POA: Diagnosis not present

## 2017-04-07 DIAGNOSIS — N183 Chronic kidney disease, stage 3 (moderate): Secondary | ICD-10-CM | POA: Diagnosis not present

## 2017-04-07 DIAGNOSIS — D631 Anemia in chronic kidney disease: Secondary | ICD-10-CM | POA: Diagnosis not present

## 2017-04-07 DIAGNOSIS — J449 Chronic obstructive pulmonary disease, unspecified: Secondary | ICD-10-CM | POA: Diagnosis not present

## 2017-04-08 DIAGNOSIS — S72001D Fracture of unspecified part of neck of right femur, subsequent encounter for closed fracture with routine healing: Secondary | ICD-10-CM | POA: Diagnosis not present

## 2017-04-08 DIAGNOSIS — I130 Hypertensive heart and chronic kidney disease with heart failure and stage 1 through stage 4 chronic kidney disease, or unspecified chronic kidney disease: Secondary | ICD-10-CM | POA: Diagnosis not present

## 2017-04-08 DIAGNOSIS — D631 Anemia in chronic kidney disease: Secondary | ICD-10-CM | POA: Diagnosis not present

## 2017-04-08 DIAGNOSIS — I509 Heart failure, unspecified: Secondary | ICD-10-CM | POA: Diagnosis not present

## 2017-04-08 DIAGNOSIS — J449 Chronic obstructive pulmonary disease, unspecified: Secondary | ICD-10-CM | POA: Diagnosis not present

## 2017-04-08 DIAGNOSIS — N183 Chronic kidney disease, stage 3 (moderate): Secondary | ICD-10-CM | POA: Diagnosis not present

## 2017-04-11 DIAGNOSIS — N183 Chronic kidney disease, stage 3 (moderate): Secondary | ICD-10-CM | POA: Diagnosis not present

## 2017-04-11 DIAGNOSIS — D631 Anemia in chronic kidney disease: Secondary | ICD-10-CM | POA: Diagnosis not present

## 2017-04-11 DIAGNOSIS — J449 Chronic obstructive pulmonary disease, unspecified: Secondary | ICD-10-CM | POA: Diagnosis not present

## 2017-04-11 DIAGNOSIS — S72001D Fracture of unspecified part of neck of right femur, subsequent encounter for closed fracture with routine healing: Secondary | ICD-10-CM | POA: Diagnosis not present

## 2017-04-11 DIAGNOSIS — Z6829 Body mass index (BMI) 29.0-29.9, adult: Secondary | ICD-10-CM | POA: Diagnosis not present

## 2017-04-11 DIAGNOSIS — I509 Heart failure, unspecified: Secondary | ICD-10-CM | POA: Diagnosis not present

## 2017-04-11 DIAGNOSIS — G4710 Hypersomnia, unspecified: Secondary | ICD-10-CM | POA: Diagnosis not present

## 2017-04-11 DIAGNOSIS — I130 Hypertensive heart and chronic kidney disease with heart failure and stage 1 through stage 4 chronic kidney disease, or unspecified chronic kidney disease: Secondary | ICD-10-CM | POA: Diagnosis not present

## 2017-04-12 DIAGNOSIS — N183 Chronic kidney disease, stage 3 (moderate): Secondary | ICD-10-CM | POA: Diagnosis not present

## 2017-04-12 DIAGNOSIS — J449 Chronic obstructive pulmonary disease, unspecified: Secondary | ICD-10-CM | POA: Diagnosis not present

## 2017-04-12 DIAGNOSIS — D631 Anemia in chronic kidney disease: Secondary | ICD-10-CM | POA: Diagnosis not present

## 2017-04-12 DIAGNOSIS — I509 Heart failure, unspecified: Secondary | ICD-10-CM | POA: Diagnosis not present

## 2017-04-12 DIAGNOSIS — I130 Hypertensive heart and chronic kidney disease with heart failure and stage 1 through stage 4 chronic kidney disease, or unspecified chronic kidney disease: Secondary | ICD-10-CM | POA: Diagnosis not present

## 2017-04-12 DIAGNOSIS — S72001D Fracture of unspecified part of neck of right femur, subsequent encounter for closed fracture with routine healing: Secondary | ICD-10-CM | POA: Diagnosis not present

## 2017-04-13 ENCOUNTER — Encounter: Payer: Self-pay | Admitting: *Deleted

## 2017-04-13 ENCOUNTER — Other Ambulatory Visit: Payer: Self-pay | Admitting: *Deleted

## 2017-04-13 DIAGNOSIS — S72001D Fracture of unspecified part of neck of right femur, subsequent encounter for closed fracture with routine healing: Secondary | ICD-10-CM | POA: Diagnosis not present

## 2017-04-13 DIAGNOSIS — N183 Chronic kidney disease, stage 3 (moderate): Secondary | ICD-10-CM | POA: Diagnosis not present

## 2017-04-13 DIAGNOSIS — D631 Anemia in chronic kidney disease: Secondary | ICD-10-CM | POA: Diagnosis not present

## 2017-04-13 DIAGNOSIS — J449 Chronic obstructive pulmonary disease, unspecified: Secondary | ICD-10-CM | POA: Diagnosis not present

## 2017-04-13 DIAGNOSIS — I509 Heart failure, unspecified: Secondary | ICD-10-CM | POA: Diagnosis not present

## 2017-04-13 DIAGNOSIS — I130 Hypertensive heart and chronic kidney disease with heart failure and stage 1 through stage 4 chronic kidney disease, or unspecified chronic kidney disease: Secondary | ICD-10-CM | POA: Diagnosis not present

## 2017-04-13 NOTE — Patient Outreach (Signed)
[place] [facility]) Care Management   04/13/2017  JORIAN WILLHOITE 01-20-1926 [id number]  Wesley Baker is an 84 y.o. male  Subjective:  Patient reports having a pretty good day,continuing daily routine with walking.   Objective:  BP 124/78 (BP Location: Right Arm, Patient Position: Sitting, Cuff Size: Normal)   Pulse 68   Resp 19   Ht 1.753 m (5\' 9" )   Wt 179 lb (81.2 kg)   SpO2 96%   BMI 26.43 kg/m  Ambulating in home upon my arrival .  Review of Systems  Constitutional: Negative.   HENT: Negative.   Respiratory: Positive for shortness of breath.        Sob with activity but resolved after resting   Cardiovascular: Positive for leg swelling.       Right leg   Gastrointestinal: Negative.   Genitourinary: Negative.   Musculoskeletal: Positive for joint pain.  Skin: Negative.   Neurological: Negative.   Endo/Heme/Allergies: Bruises/bleeds easily.  Psychiatric/Behavioral: Negative.     Physical Exam  Constitutional: He is oriented to person, place, and time. He appears well-developed and well-nourished.  Cardiovascular: Normal rate and normal heart sounds.  Respiratory: Effort normal.  GI: Soft.  Neurological: He is alert and oriented to person, place, and time.  Skin: Skin is warm and dry.     Right heel upper outer area, 1cm scab dry area, intact no redness.  Wearing support stocking to right leg   Psychiatric: He has a normal mood and affect. His behavior is normal. Judgment and thought content normal.    Encounter Medications:   Outpatient Encounter Medications as of 04/13/2017  Medication Sig Note  . allopurinol (ZYLOPRIM) 300 MG tablet Take 300 mg by mouth daily.   . budesonide-formoterol (SYMBICORT) 160-4.5 MCG/ACT inhaler Inhale 2 puffs into the lungs 2 (two) times daily.   . furosemide (LASIX) 40 MG tablet Take 40 mg by mouth 2 (two) times daily.   Marland Kitchen gabapentin (NEURONTIN) 600 MG tablet Take 600 mg by mouth 3 (three) times daily.   Marland Kitchen  ipratropium (ATROVENT) 0.06 % nasal spray Place 1 spray into the nose 2 (two) times daily.   Marland Kitchen ipratropium-albuterol (DUONEB) 0.5-2.5 (3) MG/3ML SOLN Inhale 3 mLs into the lungs 2 (two) times daily.   . metoprolol succinate (TOPROL-XL) 100 MG 24 hr tablet Take 150 mg by mouth daily.   Marland Kitchen warfarin (COUMADIN) 2.5 MG tablet Take 2.5 mg by mouth at bedtime. 03/10/2017: Monday took 2.5 has not taken since Monday Alternate 2.5 and 1.25 every other day  . HYDROcodone-acetaminophen (NORCO) 5-325 MG tablet Take 1-2 tablets by mouth every 6 (six) hours as needed for moderate pain. (Patient not taking: Reported on 03/30/2017)    No facility-administered encounter medications on file as of 04/13/2017.     Functional Status:   In your present state of health, do you have any difficulty performing the following activities: 04/06/2017 03/10/2017  Hearing? Y -  Comment wears hearing aid -  Vision? N -  Difficulty concentrating or making decisions? N -  Walking or climbing stairs? Y Y  Comment - after this fall  Dressing or bathing? Y -  Comment son is helping  -  Doing errands, shopping? Y -  Comment Patient son is available to assist  -  Conservation officer, nature and eating ? Y -  Using the Toilet? N -  In the past six months, have you accidently leaked urine? N -  Do you have problems with loss of  bowel control? N -  Managing your Medications? Y -  Comment son assist  -  Managing your Finances? Y -  Comment son assist  -  Housekeeping or managing your Housekeeping? Y -  Comment son lives with patient and manages  -  Some recent data might be hidden    Fall/Depression Screening:    Fall Risk  03/30/2017  Falls in the past year? Yes  Number falls in past yr: 2 or more  Injury with Fall? Yes  Risk Factor Category  High Fall Risk  Risk for fall due to : Impaired balance/gait;History of fall(s)  Follow up Falls prevention discussed   PHQ 2/9 Scores 04/13/2017  PHQ - 2 Score 0    Assessment:  Routine home  visit. Patient son [name] present.   Right hip fracture repair Patient progressing with mobility,participates in daily walking regimen in home,  Good appetite. Patient declined assessment of hip incision, son report it looks well only one steristip in place. Has ortho appointment in the next 2 weeks. Patient sleeps in recliner chair . Has swelling right leg, wearing support stocking .   COPD Patient reports having usual breathing , short of breath with activity, resolves with rest . Taking usual respiratory medication as prescribed.   Fall Risk  High fall risk, home safety measures in place, ramp, rugs have been  removed, new walk in shower with grab bars.   Plan:  RN will continue to follow in transition of care program , next call in a week.  Reviewed COPD zone chart information, and THN calendar .  Provided and reviewed welcome packet.  RN CM provided and reviewed EMMI handout on preventing falls.  Will send PCP visit note.   THN CM Care Plan Problem One     Most Recent Value  Care Plan Problem One  Risk for readmission related to recent hospital discharge and SNF for rehab,after right hip surgery.    Role Documenting the Problem One  Care Management [place] for Problem One  Active  THN Long Term Goal   Patient will report no hospital redmission in the next 60 days   THN Long Term Goal Start Date  03/30/17  Interventions for Problem One Long Term Goal  Home visit completed, home safety evaluation .   THN CM Short Term Goal #1   Patient will report no falls in the next 30 days   THN CM Short Term Goal #1 Start Date  03/30/17  Interventions for Short Term Goal #1  provided and reviewed EMMI handout on preventing falls,   THN CM Short Term Goal #2   Patient will report increased strength and balance in the next 30 days   THN CM Short Term Goal #2 Start Date  03/30/17  Interventions for Short Term Goal #2  RN CM reviewed progress with mobility, encouraged continued  particiipation in therapy and exercise routines       Joylene Draft, RN, [place] Management Coordinator  [phone]- Mobile [phone]- [place] Office

## 2017-04-15 DIAGNOSIS — I130 Hypertensive heart and chronic kidney disease with heart failure and stage 1 through stage 4 chronic kidney disease, or unspecified chronic kidney disease: Secondary | ICD-10-CM | POA: Diagnosis not present

## 2017-04-15 DIAGNOSIS — I509 Heart failure, unspecified: Secondary | ICD-10-CM | POA: Diagnosis not present

## 2017-04-15 DIAGNOSIS — J449 Chronic obstructive pulmonary disease, unspecified: Secondary | ICD-10-CM | POA: Diagnosis not present

## 2017-04-15 DIAGNOSIS — D631 Anemia in chronic kidney disease: Secondary | ICD-10-CM | POA: Diagnosis not present

## 2017-04-15 DIAGNOSIS — S72001D Fracture of unspecified part of neck of right femur, subsequent encounter for closed fracture with routine healing: Secondary | ICD-10-CM | POA: Diagnosis not present

## 2017-04-15 DIAGNOSIS — N183 Chronic kidney disease, stage 3 (moderate): Secondary | ICD-10-CM | POA: Diagnosis not present

## 2017-04-18 DIAGNOSIS — S72001D Fracture of unspecified part of neck of right femur, subsequent encounter for closed fracture with routine healing: Secondary | ICD-10-CM | POA: Diagnosis not present

## 2017-04-18 DIAGNOSIS — I130 Hypertensive heart and chronic kidney disease with heart failure and stage 1 through stage 4 chronic kidney disease, or unspecified chronic kidney disease: Secondary | ICD-10-CM | POA: Diagnosis not present

## 2017-04-18 DIAGNOSIS — J449 Chronic obstructive pulmonary disease, unspecified: Secondary | ICD-10-CM | POA: Diagnosis not present

## 2017-04-18 DIAGNOSIS — N183 Chronic kidney disease, stage 3 (moderate): Secondary | ICD-10-CM | POA: Diagnosis not present

## 2017-04-18 DIAGNOSIS — I509 Heart failure, unspecified: Secondary | ICD-10-CM | POA: Diagnosis not present

## 2017-04-18 DIAGNOSIS — D631 Anemia in chronic kidney disease: Secondary | ICD-10-CM | POA: Diagnosis not present

## 2017-04-19 DIAGNOSIS — I130 Hypertensive heart and chronic kidney disease with heart failure and stage 1 through stage 4 chronic kidney disease, or unspecified chronic kidney disease: Secondary | ICD-10-CM | POA: Diagnosis not present

## 2017-04-19 DIAGNOSIS — S72001D Fracture of unspecified part of neck of right femur, subsequent encounter for closed fracture with routine healing: Secondary | ICD-10-CM | POA: Diagnosis not present

## 2017-04-19 DIAGNOSIS — I509 Heart failure, unspecified: Secondary | ICD-10-CM | POA: Diagnosis not present

## 2017-04-19 DIAGNOSIS — J449 Chronic obstructive pulmonary disease, unspecified: Secondary | ICD-10-CM | POA: Diagnosis not present

## 2017-04-19 DIAGNOSIS — N183 Chronic kidney disease, stage 3 (moderate): Secondary | ICD-10-CM | POA: Diagnosis not present

## 2017-04-19 DIAGNOSIS — D631 Anemia in chronic kidney disease: Secondary | ICD-10-CM | POA: Diagnosis not present

## 2017-04-20 ENCOUNTER — Encounter: Payer: Self-pay | Admitting: *Deleted

## 2017-04-20 ENCOUNTER — Other Ambulatory Visit: Payer: Self-pay | Admitting: *Deleted

## 2017-04-20 DIAGNOSIS — D631 Anemia in chronic kidney disease: Secondary | ICD-10-CM | POA: Diagnosis not present

## 2017-04-20 DIAGNOSIS — I509 Heart failure, unspecified: Secondary | ICD-10-CM | POA: Diagnosis not present

## 2017-04-20 DIAGNOSIS — I130 Hypertensive heart and chronic kidney disease with heart failure and stage 1 through stage 4 chronic kidney disease, or unspecified chronic kidney disease: Secondary | ICD-10-CM | POA: Diagnosis not present

## 2017-04-20 DIAGNOSIS — N183 Chronic kidney disease, stage 3 (moderate): Secondary | ICD-10-CM | POA: Diagnosis not present

## 2017-04-20 DIAGNOSIS — S72001D Fracture of unspecified part of neck of right femur, subsequent encounter for closed fracture with routine healing: Secondary | ICD-10-CM | POA: Diagnosis not present

## 2017-04-20 DIAGNOSIS — J449 Chronic obstructive pulmonary disease, unspecified: Secondary | ICD-10-CM | POA: Diagnosis not present

## 2017-04-20 NOTE — Patient Outreach (Signed)
[place] [facility]) Care Management  04/20/2017  JAMAAR HOWES [birth year] [id number]   Transition of care call    Successful outreach call to patient, HIPAA verified.  Patient reports that he is doing well, continuing to be able to progress with mobility at home with pain controlled.  Patient continues to tolerate diet, denies any increase in shortness of breath or cough, at his usual breathing.   Patient denies any new concerns a this time, anticipates home health PT visit on today.  Plan RNCM will place weekly transition of care call in the next week.       THN CM Care Plan Problem One     Most Recent Value  Care Plan Problem One  Risk for readmission related to recent hospital discharge and SNF for rehab,after right hip surgery.    Role Documenting the Problem One  Care Management Coordinator  Care Plan for Problem One  Active  THN Long Term Goal   Patient will report no hospital redmission in the next 60 days   THN Long Term Goal Start Date  03/30/17  Interventions for Problem One Long Term Goal  RNCM discussed with patient current status and notification to MD office of any new concerns regarding pain , shortness of breath.   THN CM Short Term Goal #1   Patient will report no falls in the next 30 days   THN CM Short Term Goal #1 Start Date  03/30/17  Interventions for Short Term Goal #1  RNCM reviewed fall prevention from [facility] handout, patient teach back.   THN CM Short Term Goal #2   Patient will report increased strength and balance in the next 30 days   THN CM Short Term Goal #2 Start Date  03/30/17  Interventions for Short Term Goal #2  RNCM discussed progress with therapy , positive reinforcment with continued exercise plan         Joylene Draft, RN, [place] Management Coordinator  [phone]- Mobile [phone]- [place] Office

## 2017-04-21 DIAGNOSIS — J449 Chronic obstructive pulmonary disease, unspecified: Secondary | ICD-10-CM | POA: Diagnosis not present

## 2017-04-21 DIAGNOSIS — N183 Chronic kidney disease, stage 3 (moderate): Secondary | ICD-10-CM | POA: Diagnosis not present

## 2017-04-21 DIAGNOSIS — D631 Anemia in chronic kidney disease: Secondary | ICD-10-CM | POA: Diagnosis not present

## 2017-04-21 DIAGNOSIS — S72001D Fracture of unspecified part of neck of right femur, subsequent encounter for closed fracture with routine healing: Secondary | ICD-10-CM | POA: Diagnosis not present

## 2017-04-21 DIAGNOSIS — I130 Hypertensive heart and chronic kidney disease with heart failure and stage 1 through stage 4 chronic kidney disease, or unspecified chronic kidney disease: Secondary | ICD-10-CM | POA: Diagnosis not present

## 2017-04-21 DIAGNOSIS — I509 Heart failure, unspecified: Secondary | ICD-10-CM | POA: Diagnosis not present

## 2017-04-26 DIAGNOSIS — N183 Chronic kidney disease, stage 3 (moderate): Secondary | ICD-10-CM | POA: Diagnosis not present

## 2017-04-26 DIAGNOSIS — I509 Heart failure, unspecified: Secondary | ICD-10-CM | POA: Diagnosis not present

## 2017-04-26 DIAGNOSIS — J449 Chronic obstructive pulmonary disease, unspecified: Secondary | ICD-10-CM | POA: Diagnosis not present

## 2017-04-26 DIAGNOSIS — I130 Hypertensive heart and chronic kidney disease with heart failure and stage 1 through stage 4 chronic kidney disease, or unspecified chronic kidney disease: Secondary | ICD-10-CM | POA: Diagnosis not present

## 2017-04-26 DIAGNOSIS — D631 Anemia in chronic kidney disease: Secondary | ICD-10-CM | POA: Diagnosis not present

## 2017-04-26 DIAGNOSIS — S72001D Fracture of unspecified part of neck of right femur, subsequent encounter for closed fracture with routine healing: Secondary | ICD-10-CM | POA: Diagnosis not present

## 2017-04-27 ENCOUNTER — Other Ambulatory Visit: Payer: Self-pay | Admitting: *Deleted

## 2017-04-27 DIAGNOSIS — S72001D Fracture of unspecified part of neck of right femur, subsequent encounter for closed fracture with routine healing: Secondary | ICD-10-CM | POA: Diagnosis not present

## 2017-04-27 DIAGNOSIS — N183 Chronic kidney disease, stage 3 (moderate): Secondary | ICD-10-CM | POA: Diagnosis not present

## 2017-04-27 DIAGNOSIS — I509 Heart failure, unspecified: Secondary | ICD-10-CM | POA: Diagnosis not present

## 2017-04-27 DIAGNOSIS — J449 Chronic obstructive pulmonary disease, unspecified: Secondary | ICD-10-CM | POA: Diagnosis not present

## 2017-04-27 DIAGNOSIS — D631 Anemia in chronic kidney disease: Secondary | ICD-10-CM | POA: Diagnosis not present

## 2017-04-27 DIAGNOSIS — M25551 Pain in right hip: Secondary | ICD-10-CM | POA: Diagnosis not present

## 2017-04-27 DIAGNOSIS — I130 Hypertensive heart and chronic kidney disease with heart failure and stage 1 through stage 4 chronic kidney disease, or unspecified chronic kidney disease: Secondary | ICD-10-CM | POA: Diagnosis not present

## 2017-04-27 DIAGNOSIS — M25561 Pain in right knee: Secondary | ICD-10-CM | POA: Diagnosis not present

## 2017-04-27 NOTE — Patient Outreach (Addendum)
[place] [facility]) Care Management  04/27/2017  Wesley Baker 1925-10-03 [id number]   Transition of care   Successful outreach call to patient HIPAA verified. Patient reports he is doing okay today but thinks he may have overdone it a little bit on yesterday  with increasing amount of walking time in the home. Denies increase of  swelling or redness or concerns at incision .  Patient discussed he is still followed by home health PT and anticipates a visit on 4/4. Patient discussed he has upcoming follow up visit with orthopedic MD.   Patient denies any new concerns, denies increase in shortness of breath, or cough, has swelling in right leg that is about the same.  states breathing is at his usual and continuing medication as prescribed.    Plan  Will plan final transition of care call in the next week, discussed covering care manager will place call, and I will follow up in the next 2  weeks by telephone.  Reviewed with patient adhering to physical therapy recommendation for progression of mobility and exercise.   Joylene Draft, RN, [place] Management Coordinator  [phone]- Mobile [phone]- Toll Free Main Office

## 2017-05-02 DIAGNOSIS — I509 Heart failure, unspecified: Secondary | ICD-10-CM | POA: Diagnosis not present

## 2017-05-02 DIAGNOSIS — J449 Chronic obstructive pulmonary disease, unspecified: Secondary | ICD-10-CM | POA: Diagnosis not present

## 2017-05-02 DIAGNOSIS — I130 Hypertensive heart and chronic kidney disease with heart failure and stage 1 through stage 4 chronic kidney disease, or unspecified chronic kidney disease: Secondary | ICD-10-CM | POA: Diagnosis not present

## 2017-05-02 DIAGNOSIS — N183 Chronic kidney disease, stage 3 (moderate): Secondary | ICD-10-CM | POA: Diagnosis not present

## 2017-05-02 DIAGNOSIS — D631 Anemia in chronic kidney disease: Secondary | ICD-10-CM | POA: Diagnosis not present

## 2017-05-02 DIAGNOSIS — S72001D Fracture of unspecified part of neck of right femur, subsequent encounter for closed fracture with routine healing: Secondary | ICD-10-CM | POA: Diagnosis not present

## 2017-05-04 ENCOUNTER — Other Ambulatory Visit: Payer: Self-pay | Admitting: *Deleted

## 2017-05-04 DIAGNOSIS — J449 Chronic obstructive pulmonary disease, unspecified: Secondary | ICD-10-CM | POA: Diagnosis not present

## 2017-05-04 DIAGNOSIS — I509 Heart failure, unspecified: Secondary | ICD-10-CM | POA: Diagnosis not present

## 2017-05-04 DIAGNOSIS — I130 Hypertensive heart and chronic kidney disease with heart failure and stage 1 through stage 4 chronic kidney disease, or unspecified chronic kidney disease: Secondary | ICD-10-CM | POA: Diagnosis not present

## 2017-05-04 DIAGNOSIS — D631 Anemia in chronic kidney disease: Secondary | ICD-10-CM | POA: Diagnosis not present

## 2017-05-04 DIAGNOSIS — N183 Chronic kidney disease, stage 3 (moderate): Secondary | ICD-10-CM | POA: Diagnosis not present

## 2017-05-04 DIAGNOSIS — S72001D Fracture of unspecified part of neck of right femur, subsequent encounter for closed fracture with routine healing: Secondary | ICD-10-CM | POA: Diagnosis not present

## 2017-05-04 NOTE — Patient Outreach (Addendum)
[place] [facility]) Care Management  05/04/2017  Wesley Baker Jul 27, 1925 [id number]  Final transition of care call  Unsuccessful telephone outreach x 2 attempts on today, able to leave HIPAA compliant message for return call.   Patient has completed transition of care program.  Plan  Will continue to follow as complex care management program, plan follow up call in the next 2 weeks.    Joylene Draft, RN, [place] Management Coordinator  [phone]- Mobile [phone]- Toll Free Main Office

## 2017-05-05 DIAGNOSIS — I4891 Unspecified atrial fibrillation: Secondary | ICD-10-CM | POA: Diagnosis not present

## 2017-05-05 DIAGNOSIS — Z5181 Encounter for therapeutic drug level monitoring: Secondary | ICD-10-CM | POA: Diagnosis not present

## 2017-05-05 DIAGNOSIS — Z7901 Long term (current) use of anticoagulants: Secondary | ICD-10-CM | POA: Diagnosis not present

## 2017-05-09 ENCOUNTER — Other Ambulatory Visit (HOSPITAL_COMMUNITY): Payer: Self-pay | Admitting: Orthopedic Surgery

## 2017-05-09 DIAGNOSIS — M545 Low back pain: Secondary | ICD-10-CM | POA: Diagnosis not present

## 2017-05-09 DIAGNOSIS — M549 Dorsalgia, unspecified: Secondary | ICD-10-CM

## 2017-05-10 ENCOUNTER — Ambulatory Visit (HOSPITAL_COMMUNITY): Payer: Medicare HMO

## 2017-05-10 ENCOUNTER — Ambulatory Visit: Payer: Self-pay | Admitting: *Deleted

## 2017-05-11 DIAGNOSIS — M25551 Pain in right hip: Secondary | ICD-10-CM | POA: Diagnosis not present

## 2017-05-11 DIAGNOSIS — M545 Low back pain: Secondary | ICD-10-CM | POA: Diagnosis not present

## 2017-05-12 ENCOUNTER — Other Ambulatory Visit: Payer: Self-pay | Admitting: *Deleted

## 2017-05-12 DIAGNOSIS — Z96641 Presence of right artificial hip joint: Secondary | ICD-10-CM | POA: Diagnosis not present

## 2017-05-12 NOTE — Patient Outreach (Signed)
[facility]) Care Management  05/12/2017  Wesley Baker [birth year] [id number]   Telephone outreach   Successful outreach call to patient, HIPAA information verified.  Patient reports feeling better, he discussed recent visit to orthopedic Dr.Murphy, he reports everything looked okay.  Patient discussed he has completed home health therapy services and he tolerating daily walking and exercise plan at home.  Patient reports good appetite and pain controlled.  Patient reports his breathing is as usual denies any new concerns increase in shortness of breath, cough .   Patient denies any new concerns at this time.   Plan  Will plan follow up call in the next 2 weeks.    Joylene Draft, RN, [place] Management Coordinator  [phone]- Mobile [phone]- Toll Free Main Office

## 2017-05-13 DIAGNOSIS — R32 Unspecified urinary incontinence: Secondary | ICD-10-CM | POA: Diagnosis not present

## 2017-05-13 DIAGNOSIS — R4182 Altered mental status, unspecified: Secondary | ICD-10-CM | POA: Diagnosis not present

## 2017-05-16 ENCOUNTER — Ambulatory Visit: Payer: Self-pay | Admitting: *Deleted

## 2017-05-23 ENCOUNTER — Other Ambulatory Visit: Payer: Self-pay | Admitting: *Deleted

## 2017-05-23 NOTE — Patient Outreach (Signed)
[place] [facility]) Care Management  05/23/2017  FALCON MCCASKEY 1925-12-10 [id number]  Telephone assessment   Ad[facility]: [facility]14[facility]/18/[facility]  Dx: Closed hip fracture Surgery-Hemi-arthroplasty Discharged to SNF-[facility] 3/18 Discharged from [facility] 3/2  Unsuccessful outreach call to patient, unable to leave a message on telephone,allowed phone to ring for at least 10 rings no answer.   1415 Return call to patient, successful HIPAA verified.Patient discussed having a problem related to his right hip area and he has been in the wheelchair for the last few weeks and therapy has been put on hold  due to problem . Patient discussed he has a visit with Dr.Murphy, orthopedic surgeon on this week and hopefully he will be able to get back with therapy after xray.  Patient reports he has been sleeping in his recliner or bed. Patient reports using a urinal during the day and his son is available to assist him to the bathroom. Patient denies having fall , discussed a few weeks ago legs buckling while walking in kitchen ,  but his son was behind him caught him and helped him  to chair.  Patient reports having a good appetite and pain controlled . Marland Kitchen    Plan  Will plan home visit in the next 2 weeks, patient agreeable to follow up visit , but wants to make sure a call placed to back when son is home to verify.    Joylene Draft, RN, [place] Management Coordinator  [phone]- Mobile [phone]- Toll Free Main Office

## 2017-05-25 DIAGNOSIS — S72001D Fracture of unspecified part of neck of right femur, subsequent encounter for closed fracture with routine healing: Secondary | ICD-10-CM | POA: Diagnosis not present

## 2017-05-31 ENCOUNTER — Other Ambulatory Visit: Payer: Self-pay | Admitting: *Deleted

## 2017-05-31 NOTE — Patient Outreach (Signed)
[place] [facility]) Care Management   05/31/2017  Wesley Baker [birth year] [id number]  Wesley Baker is an 84 y.o. male  Subjective:  Patient discussed doing okay except for problem with right hip, hairline fracture in femur that was discovered on follow up xray/MRI ,patient son states occurred at result of implant placement at hip surgery, no surgery required just allow to heal, and limit weight bearing.   Objective:  BP 132/70 (BP Location: Right Arm, Patient Position: Sitting, Cuff Size: Normal)   Pulse 62   Resp 18   SpO2 97%  Review of Systems  Constitutional: Negative.   HENT: Negative.   Eyes: Negative.   Respiratory: Negative.   Cardiovascular: Positive for leg swelling.       Wearing compression hose during the day and off at night   Gastrointestinal: Negative.   Genitourinary: Negative.   Musculoskeletal: Positive for joint pain.       Right hip   Skin: Negative.   Neurological: Negative.   Endo/Heme/Allergies: Bruises/bleeds easily.  Psychiatric/Behavioral: Negative.     Physical Exam  Constitutional: He is oriented to person, place, and time. He appears well-developed and well-nourished.  Cardiovascular: Normal rate and normal heart sounds.  Respiratory: Effort normal.  GI: Soft.  Neurological: He is alert and oriented to person, place, and time.  Skin: Skin is warm and dry.  Psychiatric: He has a normal mood and affect. His behavior is normal. Judgment and thought content normal.    Encounter Medications:   Outpatient Encounter Medications as of 05/31/2017  Medication Sig Note  . allopurinol (ZYLOPRIM) 300 MG tablet Take 300 mg by mouth daily.   . budesonide-formoterol (SYMBICORT) 160-4.5 MCG/ACT inhaler Inhale 2 puffs into the lungs 2 (two) times daily.   . furosemide (LASIX) 40 MG tablet Take 40 mg by mouth 2 (two) times daily.   Marland Kitchen gabapentin (NEURONTIN) 600 MG tablet Take 600 mg by mouth 3 (three) times daily.   Marland Kitchen HYDROcodone-acetaminophen  (NORCO) 5-325 MG tablet Take 1-2 tablets by mouth every 6 (six) hours as needed for moderate pain. (Patient not taking: Reported on 03/30/2017)   . ipratropium (ATROVENT) 0.06 % nasal spray Place 1 spray into the nose 2 (two) times daily.   Marland Kitchen ipratropium-albuterol (DUONEB) 0.5-2.5 (3) MG/3ML SOLN Inhale 3 mLs into the lungs 2 (two) times daily.   . metoprolol succinate (TOPROL-XL) 100 MG 24 hr tablet Take 150 mg by mouth daily.   Marland Kitchen warfarin (COUMADIN) 2.5 MG tablet Take 2.5 mg by mouth at bedtime. 03/10/2017: Monday took 2.5 has not taken since Monday Alternate 2.5 and 1.25 every other day   No facility-administered encounter medications on file as of 05/31/2017.     Functional Status:   In your present state of health, do you have any difficulty performing the following activities: 04/06/2017 03/10/2017  Hearing? Y -  Comment wears hearing aid -  Vision? N -  Difficulty concentrating or making decisions? N -  Walking or climbing stairs? Y Y  Comment - after this fall  Dressing or bathing? Y -  Comment son is helping  -  Doing errands, shopping? Y -  Comment Patient son is available to assist  -  Conservation officer, nature and eating ? Y -  Using the Toilet? N -  In the past six months, have you accidently leaked urine? N -  Do you have problems with loss of bowel control? N -  Managing your Medications? Y -  Comment son assist  -  Managing your Finances? Y -  Comment son assist  -  Housekeeping or managing your Housekeeping? Y -  Comment son lives with patient and manages  -  Some recent data might be hidden    Fall/Depression Screening:    Fall Risk  03/30/2017  Falls in the past year? Yes  Number falls in past yr: 2 or more  Injury with Fall? Yes  Risk Factor Category  High Fall Risk  Risk for fall due to : Impaired balance/gait;History of fall(s)  Follow up Falls prevention discussed   PHQ 2/9 Scores 04/13/2017  PHQ - 2 Score 0    Assessment:  Routine home visit, patient son Wesley Baker  present.  Recent Right hip surgery, hairline fracture of femur Patient adhering to mobility restrictions, tolerating non weight bearing exercise.   COPD Usual breathing status, green zone per review of COPD zones    Patient and son denies any new concerns.  Patient has PCP visit in the next week and orthopedic visit in next 2 weeks.   Patient will benefit from ongoing [facility] care management follow up post hip surgery progression of mobility.   Plan:  Will plan follow up call in the next 2 weeks.   THN CM Care Plan Problem One     Most Recent Value  Care Plan Problem One  Risk for readmission related to recent hospital discharge and SNF for rehab,after right hip surgery.    Role Documenting the Problem One  Care Management Coordinator  Care Plan for Problem One  Active  THN Long Term Goal   Patient will report no hospital readmission in the next 90 days  [goal adjusted ]  THN Long Term Goal Start Date  03/30/17  Interventions for Problem One Long Term Goal  Reviewed current clinical states, reinforced continuing to take medication as prescribed., reviewed upcoming medical appointments   THN CM Short Term Goal #1   Patient will report no falls in the next 30 days   THN CM Short Term Goal #1 Start Date  05/23/17  Interventions for Short Term Goal #1  discussed fall prevention measured, discussed with patient , making sure son nearby to assist with transfers, continue to use walkie talkie system to contact son.   THN CM Short Term Goal #2   Patient will report increased strength and balance in the next 30 days   THN CM Short Term Goal #2 Start Date  03/30/17  [facility] CM Short Term Goal #2 Met Date  05/12/17  THN CM Short Term Goal #3  Patient will continue to complete daily exercise plan  in the next 28 days  [goal restated ]  THN CM Short Term Goal #3 Start Date  05/12/17  Interventions for Short [place] Goal #3  Reviewed limitations with weight bearing , and encourged regarding continuing none  weight bearing exercises and  use of stationary bicycle per ortho recommendations       Joylene Draft, RN, [place] Management Coordinator  [phone]- Mobile [phone]- [place]

## 2017-06-06 DIAGNOSIS — I4891 Unspecified atrial fibrillation: Secondary | ICD-10-CM | POA: Diagnosis not present

## 2017-06-06 DIAGNOSIS — N393 Stress incontinence (female) (male): Secondary | ICD-10-CM | POA: Diagnosis not present

## 2017-06-06 DIAGNOSIS — Z7901 Long term (current) use of anticoagulants: Secondary | ICD-10-CM | POA: Diagnosis not present

## 2017-06-06 DIAGNOSIS — Z5181 Encounter for therapeutic drug level monitoring: Secondary | ICD-10-CM | POA: Diagnosis not present

## 2017-06-11 DIAGNOSIS — Z96641 Presence of right artificial hip joint: Secondary | ICD-10-CM | POA: Diagnosis not present

## 2017-06-22 ENCOUNTER — Other Ambulatory Visit: Payer: Self-pay | Admitting: *Deleted

## 2017-06-22 DIAGNOSIS — Z5181 Encounter for therapeutic drug level monitoring: Secondary | ICD-10-CM | POA: Diagnosis not present

## 2017-06-22 DIAGNOSIS — I4891 Unspecified atrial fibrillation: Secondary | ICD-10-CM | POA: Diagnosis not present

## 2017-06-22 DIAGNOSIS — Z7901 Long term (current) use of anticoagulants: Secondary | ICD-10-CM | POA: Diagnosis not present

## 2017-06-22 DIAGNOSIS — Z139 Encounter for screening, unspecified: Secondary | ICD-10-CM | POA: Diagnosis not present

## 2017-06-22 NOTE — Patient Outreach (Signed)
[facility]) Care Management  06/22/2017  DODD SCHMID 1925/08/08 [id number]  Telephone assessment   Successful outreach call to patient reports he is moving along slowly, tolerating short walks in home, tolerating resting in bed. Patient discussed upcoming appointment with orthopedic doctor on next week and hopes to be released and maybe attend outpatient physical therapy.   Patient discussed recent visit to PCP and every thing checked out okay. Patient discussed his breathing is in usual state,tolerating diet.   Plan  Will final outreach follow up call in the next week and if no new concerns will close case patient in agreement .    Joylene Draft, RN, [place] Management Coordinator  [phone]- Mobile [phone]- Toll Free Main Office

## 2017-06-23 ENCOUNTER — Ambulatory Visit: Payer: Self-pay | Admitting: *Deleted

## 2017-06-30 ENCOUNTER — Ambulatory Visit: Payer: Self-pay | Admitting: *Deleted

## 2017-07-01 ENCOUNTER — Other Ambulatory Visit: Payer: Self-pay | Admitting: *Deleted

## 2017-07-01 DIAGNOSIS — S72001D Fracture of unspecified part of neck of right femur, subsequent encounter for closed fracture with routine healing: Secondary | ICD-10-CM | POA: Diagnosis not present

## 2017-07-01 NOTE — Patient Outreach (Signed)
[facility]) Care Management  07/01/2017  Wesley Baker 06-13-1925 [id number]   Final Call for case closure   Unsuccessful outreach call to patient able to leave a HIPAA compliant message with return call .  1500  Return call from patient , HIPAA verified. Patient discussed he had appointment with orthopedic MD on today, and he has been cleared to walk in home , do not walk outside. Patient son [name] on phone and states patient will received some type of nerve stimulation device to help with bone healing in the next few week.  Patient states that he is managing well at home with current assistance from his son, patient denies any new concerns at this time. Happy 89th birthday wishes to patient.  Discussed with patient graduation from program as goals have been met, patient agreed,  discussed making sure they have [facility] care  Management contact number for future needs.   Plan  Will close case,goals have been met , will send PCP case closure letter.   Joylene Draft, RN, [place] Management Coordinator  [phone]- Mobile [phone]- Toll Free Main Office

## 2017-07-05 DIAGNOSIS — H353132 Nonexudative age-related macular degeneration, bilateral, intermediate dry stage: Secondary | ICD-10-CM | POA: Diagnosis not present

## 2017-07-05 DIAGNOSIS — Z0100 Encounter for examination of eyes and vision without abnormal findings: Secondary | ICD-10-CM | POA: Diagnosis not present

## 2017-07-05 DIAGNOSIS — Z961 Presence of intraocular lens: Secondary | ICD-10-CM | POA: Diagnosis not present

## 2017-07-06 DIAGNOSIS — I4891 Unspecified atrial fibrillation: Secondary | ICD-10-CM | POA: Diagnosis not present

## 2017-07-06 DIAGNOSIS — Z7189 Other specified counseling: Secondary | ICD-10-CM | POA: Diagnosis not present

## 2017-07-06 DIAGNOSIS — Z7901 Long term (current) use of anticoagulants: Secondary | ICD-10-CM | POA: Diagnosis not present

## 2017-07-06 DIAGNOSIS — Z5181 Encounter for therapeutic drug level monitoring: Secondary | ICD-10-CM | POA: Diagnosis not present

## 2017-07-12 DIAGNOSIS — Z96641 Presence of right artificial hip joint: Secondary | ICD-10-CM | POA: Diagnosis not present

## 2017-07-15 DIAGNOSIS — S72091K Other fracture of head and neck of right femur, subsequent encounter for closed fracture with nonunion: Secondary | ICD-10-CM | POA: Diagnosis not present

## 2017-08-03 DIAGNOSIS — Z7901 Long term (current) use of anticoagulants: Secondary | ICD-10-CM | POA: Diagnosis not present

## 2017-08-03 DIAGNOSIS — Z5181 Encounter for therapeutic drug level monitoring: Secondary | ICD-10-CM | POA: Diagnosis not present

## 2017-08-03 DIAGNOSIS — Z139 Encounter for screening, unspecified: Secondary | ICD-10-CM | POA: Diagnosis not present

## 2017-08-03 DIAGNOSIS — I4891 Unspecified atrial fibrillation: Secondary | ICD-10-CM | POA: Diagnosis not present

## 2017-08-11 DIAGNOSIS — Z96641 Presence of right artificial hip joint: Secondary | ICD-10-CM | POA: Diagnosis not present

## 2017-08-12 DIAGNOSIS — S72001D Fracture of unspecified part of neck of right femur, subsequent encounter for closed fracture with routine healing: Secondary | ICD-10-CM | POA: Diagnosis not present

## 2017-09-02 DIAGNOSIS — J449 Chronic obstructive pulmonary disease, unspecified: Secondary | ICD-10-CM | POA: Diagnosis not present

## 2017-09-02 DIAGNOSIS — I4891 Unspecified atrial fibrillation: Secondary | ICD-10-CM | POA: Diagnosis not present

## 2017-09-02 DIAGNOSIS — Z7901 Long term (current) use of anticoagulants: Secondary | ICD-10-CM | POA: Diagnosis not present

## 2017-09-02 DIAGNOSIS — Z5181 Encounter for therapeutic drug level monitoring: Secondary | ICD-10-CM | POA: Diagnosis not present

## 2017-09-11 DIAGNOSIS — Z96641 Presence of right artificial hip joint: Secondary | ICD-10-CM | POA: Diagnosis not present

## 2017-09-21 DIAGNOSIS — Z23 Encounter for immunization: Secondary | ICD-10-CM | POA: Diagnosis not present

## 2017-09-21 DIAGNOSIS — Z7901 Long term (current) use of anticoagulants: Secondary | ICD-10-CM | POA: Diagnosis not present

## 2017-09-21 DIAGNOSIS — I509 Heart failure, unspecified: Secondary | ICD-10-CM | POA: Diagnosis not present

## 2017-09-21 DIAGNOSIS — Z5181 Encounter for therapeutic drug level monitoring: Secondary | ICD-10-CM | POA: Diagnosis not present

## 2017-09-21 DIAGNOSIS — I4891 Unspecified atrial fibrillation: Secondary | ICD-10-CM | POA: Diagnosis not present

## 2017-09-21 DIAGNOSIS — J449 Chronic obstructive pulmonary disease, unspecified: Secondary | ICD-10-CM | POA: Diagnosis not present

## 2017-10-12 DIAGNOSIS — Z96641 Presence of right artificial hip joint: Secondary | ICD-10-CM | POA: Diagnosis not present

## 2017-10-14 DIAGNOSIS — I959 Hypotension, unspecified: Secondary | ICD-10-CM | POA: Diagnosis not present

## 2017-10-14 DIAGNOSIS — Z139 Encounter for screening, unspecified: Secondary | ICD-10-CM | POA: Diagnosis not present

## 2017-10-14 DIAGNOSIS — I4891 Unspecified atrial fibrillation: Secondary | ICD-10-CM | POA: Diagnosis not present

## 2017-10-14 DIAGNOSIS — Z5181 Encounter for therapeutic drug level monitoring: Secondary | ICD-10-CM | POA: Diagnosis not present

## 2017-10-14 DIAGNOSIS — R042 Hemoptysis: Secondary | ICD-10-CM | POA: Diagnosis not present

## 2017-10-21 DIAGNOSIS — R000 Tachycardia, unspecified: Secondary | ICD-10-CM | POA: Diagnosis not present

## 2017-10-21 DIAGNOSIS — Z139 Encounter for screening, unspecified: Secondary | ICD-10-CM | POA: Diagnosis not present

## 2017-10-21 DIAGNOSIS — I509 Heart failure, unspecified: Secondary | ICD-10-CM | POA: Diagnosis not present

## 2017-10-21 DIAGNOSIS — J449 Chronic obstructive pulmonary disease, unspecified: Secondary | ICD-10-CM | POA: Diagnosis not present

## 2017-10-24 DIAGNOSIS — I509 Heart failure, unspecified: Secondary | ICD-10-CM | POA: Diagnosis not present

## 2017-10-24 DIAGNOSIS — J181 Lobar pneumonia, unspecified organism: Secondary | ICD-10-CM | POA: Diagnosis not present

## 2017-10-24 DIAGNOSIS — J440 Chronic obstructive pulmonary disease with acute lower respiratory infection: Secondary | ICD-10-CM | POA: Diagnosis not present

## 2017-10-24 DIAGNOSIS — Z139 Encounter for screening, unspecified: Secondary | ICD-10-CM | POA: Diagnosis not present

## 2017-10-24 DIAGNOSIS — R0602 Shortness of breath: Secondary | ICD-10-CM | POA: Diagnosis not present

## 2017-10-24 DIAGNOSIS — I517 Cardiomegaly: Secondary | ICD-10-CM | POA: Diagnosis not present

## 2017-10-24 DIAGNOSIS — R079 Chest pain, unspecified: Secondary | ICD-10-CM | POA: Diagnosis not present

## 2017-10-24 DIAGNOSIS — Z6830 Body mass index (BMI) 30.0-30.9, adult: Secondary | ICD-10-CM | POA: Diagnosis not present

## 2017-10-31 DIAGNOSIS — Z5181 Encounter for therapeutic drug level monitoring: Secondary | ICD-10-CM | POA: Diagnosis not present

## 2017-10-31 DIAGNOSIS — I509 Heart failure, unspecified: Secondary | ICD-10-CM | POA: Diagnosis not present

## 2017-10-31 DIAGNOSIS — Z7901 Long term (current) use of anticoagulants: Secondary | ICD-10-CM | POA: Diagnosis not present

## 2017-10-31 DIAGNOSIS — J181 Lobar pneumonia, unspecified organism: Secondary | ICD-10-CM | POA: Diagnosis not present

## 2017-10-31 DIAGNOSIS — I4891 Unspecified atrial fibrillation: Secondary | ICD-10-CM | POA: Diagnosis not present

## 2017-11-11 DIAGNOSIS — Z96641 Presence of right artificial hip joint: Secondary | ICD-10-CM | POA: Diagnosis not present

## 2017-11-14 DIAGNOSIS — J189 Pneumonia, unspecified organism: Secondary | ICD-10-CM | POA: Diagnosis not present

## 2017-11-14 DIAGNOSIS — I517 Cardiomegaly: Secondary | ICD-10-CM | POA: Diagnosis not present

## 2017-11-21 DIAGNOSIS — Z6831 Body mass index (BMI) 31.0-31.9, adult: Secondary | ICD-10-CM | POA: Diagnosis not present

## 2017-11-21 DIAGNOSIS — J90 Pleural effusion, not elsewhere classified: Secondary | ICD-10-CM | POA: Diagnosis not present

## 2017-11-25 DIAGNOSIS — J189 Pneumonia, unspecified organism: Secondary | ICD-10-CM | POA: Diagnosis not present

## 2017-11-25 DIAGNOSIS — K449 Diaphragmatic hernia without obstruction or gangrene: Secondary | ICD-10-CM | POA: Diagnosis not present

## 2017-11-25 DIAGNOSIS — I517 Cardiomegaly: Secondary | ICD-10-CM | POA: Diagnosis not present

## 2017-11-25 DIAGNOSIS — I700 Atherosclerosis of aorta: Secondary | ICD-10-CM | POA: Diagnosis not present

## 2017-11-25 DIAGNOSIS — J439 Emphysema, unspecified: Secondary | ICD-10-CM | POA: Diagnosis not present

## 2017-11-25 DIAGNOSIS — J90 Pleural effusion, not elsewhere classified: Secondary | ICD-10-CM | POA: Diagnosis not present

## 2017-11-25 DIAGNOSIS — I2510 Atherosclerotic heart disease of native coronary artery without angina pectoris: Secondary | ICD-10-CM | POA: Diagnosis not present

## 2017-11-30 DIAGNOSIS — J90 Pleural effusion, not elsewhere classified: Secondary | ICD-10-CM | POA: Diagnosis not present

## 2017-11-30 DIAGNOSIS — J449 Chronic obstructive pulmonary disease, unspecified: Secondary | ICD-10-CM | POA: Diagnosis not present

## 2017-11-30 DIAGNOSIS — I4891 Unspecified atrial fibrillation: Secondary | ICD-10-CM | POA: Diagnosis not present

## 2017-11-30 DIAGNOSIS — Z7901 Long term (current) use of anticoagulants: Secondary | ICD-10-CM | POA: Diagnosis not present

## 2017-11-30 DIAGNOSIS — Z5181 Encounter for therapeutic drug level monitoring: Secondary | ICD-10-CM | POA: Diagnosis not present

## 2017-12-08 ENCOUNTER — Encounter: Payer: Self-pay | Admitting: Pulmonary Disease

## 2017-12-08 ENCOUNTER — Ambulatory Visit: Payer: Medicare HMO | Admitting: Pulmonary Disease

## 2017-12-08 VITALS — BP 128/74 | HR 73 | Ht <= 58 in | Wt 184.8 lb

## 2017-12-08 DIAGNOSIS — J449 Chronic obstructive pulmonary disease, unspecified: Secondary | ICD-10-CM | POA: Diagnosis not present

## 2017-12-08 DIAGNOSIS — R918 Other nonspecific abnormal finding of lung field: Secondary | ICD-10-CM | POA: Diagnosis not present

## 2017-12-08 NOTE — Progress Notes (Signed)
Synopsis: Referred in 11/2017 for abnormal CT  Subjective:   PATIENT ID: Wesley Baker GENDER: male DOB: 1925/02/05, MRN: [id number]   HPI  Chief Complaint  Patient presents with  . CONSULT    6 weeks of SOB - diag with pneumonia.  Very weak when up walking. Hx COPD but had recent CT and told needed pulm referral.  Wesley Baker is a 84 year old male former smoker with COPD, atrial fibrillation on Coumadin who presents as a new patient for abnormal CT. Son and family present.  Records faxed from 12/02/2017 by Maryella Shivers, MD.  Reviewed and summarized as below: Patient recently treated a course of levofloxacin.  CT was reviewed during this office visit (no CT report or disc available).  Per note CT "with several foci areas of undetermined significance.  Pattern consistent with lipoid pneumonia process." He was given an additonal course of levofloxin. Assessment includes chronic bilateral pleural effusions?  For the last 5-6 weeks, he has had worsening shortness of breath. Worsened with minimal activity. Has unchanged wheezing and chronic cough with productive yellow sputum every morning.  Improves with rest after 10 minutes. Family states he is not able to ambulate due to his severe dyspnea. He compliant with his Symbicort and duo nebs twice a day. Does not use his albuterol inhaler. Family reports known hx of aspiration. Has coughing with eating. Denies fevers, chills, night sweats or unexplained weight loss.  Denies any other respiratory infections in the last 12 months.  Denies recent ED visits/hospitalizations in the last 12 months.  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    96 pack year history. Quit in 1993 Environmental exposures: [employer]  I have personally reviewed patient's past medical/family/social history, allergies, current medications.  Past Medical History:  Diagnosis Date  . COPD (chronic obstructive  pulmonary disease) ([place])   . Dysrhythmia   . Gout   . Hypertension   . Pneumonia    recent and recurring over severalyears  . Sleep apnea      Family History  Problem Relation Age of Onset  . Diabetes Mellitus II Son   . Diabetes Son      Social History   Occupational History  . Not on file  Tobacco Use  . Smoking status: Former Smoker    Packs/day: 2.00    Years: 48.00    Pack years: 96.00    Types: Cigarettes    Last attempt to quit: 12/09/1991    Years since quitting: 26.0  . Smokeless tobacco: Never Used  Substance and Sexual Activity  . Alcohol use: No    Frequency: Never  . Drug use: No  . Sexual activity: Not on file    No Known Allergies   Outpatient Medications Prior to Visit  Medication Sig Dispense Refill  . allopurinol (ZYLOPRIM) 300 MG tablet Take 300 mg by mouth daily.    . budesonide-formoterol (SYMBICORT) 160-4.5 MCG/ACT inhaler Inhale 2 puffs into the lungs 2 (two) times daily.    . furosemide (LASIX) 40 MG tablet Take 40 mg by mouth 2 (two) times daily.    Marland Kitchen gabapentin (NEURONTIN) 600 MG tablet Take 600 mg by mouth 3 (three) times daily.    Marland Kitchen ipratropium (ATROVENT) 0.06 % nasal spray Place 1 spray into the nose 2 (two) times daily.    Marland Kitchen ipratropium-albuterol (DUONEB) 0.5-2.5 (3) MG/3ML SOLN Inhale 3 mLs into the lungs 2 (two) times daily.    Marland Kitchen  metoprolol succinate (TOPROL-XL) 100 MG 24 hr tablet Take 100 mg by mouth daily.     Marland Kitchen warfarin (COUMADIN) 2.5 MG tablet Take 2.5 mg by mouth at bedtime.     Marland Kitchen HYDROcodone-acetaminophen (NORCO) 5-325 MG tablet Take 1-2 tablets by mouth every 6 (six) hours as needed for moderate pain. 40 tablet 0   No facility-administered medications prior to visit.     Review of Systems  Constitutional: Positive for malaise/fatigue. Negative for chills, diaphoresis, fever and weight loss.  HENT: Negative for congestion.   Eyes: Negative for blurred vision.  Respiratory: Positive for cough, sputum production, shortness  of breath and wheezing. Negative for hemoptysis.   Cardiovascular: Positive for leg swelling (chronic). Negative for chest pain and PND.  Gastrointestinal: Negative for nausea.  Musculoskeletal: Negative for myalgias.  Skin: Negative for rash.  Neurological: Positive for weakness. Negative for focal weakness.  Psychiatric/Behavioral: The patient is not nervous/anxious.      Objective:  Physical Exam  Constitutional: He is oriented to person, place, and time. He appears well-developed and well-nourished. No distress.  HENT:  Head: Normocephalic and atraumatic.  Nose: Nose normal.  Eyes: Conjunctivae and EOM are normal. No scleral icterus.  Neck: Normal range of motion. Neck supple. No JVD present. No tracheal deviation present.  Cardiovascular: Normal rate, regular rhythm, normal heart sounds and intact distal pulses. Exam reveals no gallop and no friction rub.  No murmur heard. Pulmonary/Chest: Effort normal. No stridor. No respiratory distress. He has no wheezes.  Basilar fine crackles present  Abdominal: Soft. Bowel sounds are normal. He exhibits no distension. There is no tenderness.  Musculoskeletal: Normal range of motion. He exhibits no edema.  Neurological: He is alert and oriented to person, place, and time. No cranial nerve deficit.  Skin: Skin is warm and dry. No rash noted. He is not diaphoretic. No erythema.  Psychiatric: He has a normal mood and affect. His behavior is normal. Thought content normal.  Vitals reviewed.    Vitals:   12/08/17 1023  BP: 128/74  Pulse: 73  SpO2: 98%  Weight: 184 lb 12.8 oz (83.8 kg)  Height: 4\' 10"  (1.473 m)   SpO2: 98 % O2 Device: None (Room air)  Chest imaging: CXR 02/14/2006 Calcified lung nodule consistent with granuloma measured at 7 mm  PFT: None on file  I have personally reviewed the above labs, images and tests noted above.    Assessment & Plan:   Discussion: 84 year old gentleman with COPD, recent  community-acquired pneumonia status post outpatient treatment with Levaquin x2 courses who presents with severe gradually worsening dyspnea and abnormal CT with report concerning for lipoid pneumonia.  Unfortunately records are not available for review.  Plan  Abnormal CT --We will request for records to be sent to our office --CT chest without contrast in 8 weeks will be ordered --PFTs prior to next visit will be ordered --Advised family to bring disc of prior imaging at next visit in case we are not able to have it mailed in time  COPD, unknown severity --CONTINUE Symbicort --INCREASE Duonebs to three times daily --USE Albuterol inhaler 2 puffs every 4 hours as needed for shortness of breath or wheezing   Orders Placed This Encounter  Procedures  . CT Chest Wo Contrast    [place] [facility]. [employer].    Standing Status:   Future    Standing Expiration Date:   02/08/2019    Scheduling Instructions:     Schedule Chest CT in  8 weeks    Order Specific Question:   ** REASON FOR EXAM (FREE TEXT)    Answer:   abnormal ct    Order Specific Question:   Preferred imaging location?    Answer:   [address]    Order Specific Question:   Radiology Contrast Protocol - do NOT remove file path    Answer:   [email or web address]  . Pulmonary function test    Standing Status:   Future    Standing Expiration Date:   12/09/2018    Scheduling Instructions:     As soon as available    Order Specific Question:   Where should this test be performed?    Answer:   [facility]    Order Specific Question:   Full PFT: includes the following: basic spirometry, spirometry pre & post bronchodilator, diffusion capacity (DLCO), lung volumes    Answer:   Full PFT  No orders of the defined types were placed in this encounter. Return in about 6 weeks (around 01/19/2018).   Thank you for choosing [facility] for your health needs!   Chi Rodman Pickle, MD [facility]  Critical Care 12/08/2017 12:10 PM  Personal pager: [phone] If unanswered, please page CCM On-call: [phone]

## 2017-12-08 NOTE — Patient Instructions (Signed)
Abnormal CT --We will request for records to be sent to our office --CT chest without contrast in 8 weeks will be ordered --PFTs prior to next visit will be ordered --Advised family to bring disc of prior imaging at next visit in case we are not able to have it mailed in time  COPD, unknown severity --CONTINUE Symbicort --INCREASE Duonebs to three times daily --USE Albuterol inhaler 2 puffs every 4 hours as needed for shortness of breath or wheezing

## 2017-12-09 ENCOUNTER — Telehealth: Payer: Self-pay | Admitting: Pulmonary Disease

## 2017-12-09 NOTE — Telephone Encounter (Signed)
Contacted patient and his son, [name]. I informed them that I have reviewed his CT scan and will scan CT report into his chart. No intervention recommended at this time. Will re-evaluate with repeat CT Chest as scheduled. Pending results we will discuss further surveillance imaging vs diagnostic sampling if indicated.

## 2017-12-12 ENCOUNTER — Telehealth: Payer: Self-pay | Admitting: Pulmonary Disease

## 2017-12-12 ENCOUNTER — Encounter: Payer: Self-pay | Admitting: Pulmonary Disease

## 2017-12-12 DIAGNOSIS — Z96641 Presence of right artificial hip joint: Secondary | ICD-10-CM | POA: Diagnosis not present

## 2017-12-12 NOTE — Telephone Encounter (Signed)
Called patient and patient's son, [name], regarding CT changes and addressed their questions. It is unclear from patient's clinical history (no known environmental or chemical exposure) what has caused the changes in his lung parenchyma. Will need to repeat CT to determine if diagnostic intervention needed. I have advised optimizing patient's bronchodilator regimen per last clinic visit.   Jonelle Sidle, Please send letter to patient's PCP, Dr. Maryella Shivers. Letter will be available by the end of this afternoon.

## 2017-12-12 NOTE — Telephone Encounter (Signed)
Spoke with [name], he has some questions on what is causing the breathing problems. He states he was busy when he received results from Dr. Loanne Drilling and didn't ask the questions he meant to ask. He did hear that there is damage that was irreversible and he wants to know is there anything they can do? Dr. Loanne Drilling please advise. I believe Dr. Loanne Drilling has the CD with the images.  He would like the OV note faxed to his PCP.

## 2017-12-14 NOTE — Telephone Encounter (Signed)
Letter has been faxed to Dr. Nyra Capes. Nothing further needed.

## 2017-12-26 DIAGNOSIS — Z7901 Long term (current) use of anticoagulants: Secondary | ICD-10-CM | POA: Diagnosis not present

## 2017-12-26 DIAGNOSIS — I4891 Unspecified atrial fibrillation: Secondary | ICD-10-CM | POA: Diagnosis not present

## 2017-12-26 DIAGNOSIS — R0902 Hypoxemia: Secondary | ICD-10-CM | POA: Diagnosis not present

## 2017-12-26 DIAGNOSIS — Z5181 Encounter for therapeutic drug level monitoring: Secondary | ICD-10-CM | POA: Diagnosis not present

## 2017-12-26 DIAGNOSIS — R918 Other nonspecific abnormal finding of lung field: Secondary | ICD-10-CM | POA: Diagnosis not present

## 2018-01-03 DIAGNOSIS — H353132 Nonexudative age-related macular degeneration, bilateral, intermediate dry stage: Secondary | ICD-10-CM | POA: Diagnosis not present

## 2018-01-09 DIAGNOSIS — H353132 Nonexudative age-related macular degeneration, bilateral, intermediate dry stage: Secondary | ICD-10-CM | POA: Diagnosis not present

## 2018-01-09 DIAGNOSIS — H26491 Other secondary cataract, right eye: Secondary | ICD-10-CM | POA: Diagnosis not present

## 2018-01-09 DIAGNOSIS — H43811 Vitreous degeneration, right eye: Secondary | ICD-10-CM | POA: Diagnosis not present

## 2018-01-11 DIAGNOSIS — Z96641 Presence of right artificial hip joint: Secondary | ICD-10-CM | POA: Diagnosis not present

## 2018-01-27 DIAGNOSIS — Z7901 Long term (current) use of anticoagulants: Secondary | ICD-10-CM | POA: Diagnosis not present

## 2018-01-27 DIAGNOSIS — Z5181 Encounter for therapeutic drug level monitoring: Secondary | ICD-10-CM | POA: Diagnosis not present

## 2018-01-27 DIAGNOSIS — I4891 Unspecified atrial fibrillation: Secondary | ICD-10-CM | POA: Diagnosis not present

## 2018-02-01 ENCOUNTER — Ambulatory Visit (INDEPENDENT_AMBULATORY_CARE_PROVIDER_SITE_OTHER): Payer: Medicare HMO | Admitting: Pulmonary Disease

## 2018-02-01 DIAGNOSIS — J449 Chronic obstructive pulmonary disease, unspecified: Secondary | ICD-10-CM | POA: Diagnosis not present

## 2018-02-01 LAB — PULMONARY FUNCTION TEST
DL/VA % pred: 63 %
DL/VA: 2.89 ml/min/mmHg/L
DLCO UNC % PRED: 38 %
DLCO unc: 12.5 ml/min/mmHg
FEF 25-75 PRE: 0.62 L/s
FEF 25-75 Post: 0.92 L/sec
FEF2575-%Change-Post: 49 %
FEF2575-%PRED-POST: 62 %
FEF2575-%PRED-PRE: 41 %
FEV1-%Change-Post: 8 %
FEV1-%Pred-Post: 56 %
FEV1-%Pred-Pre: 51 %
FEV1-PRE: 1.27 L
FEV1-Post: 1.38 L
FEV1FVC-%Change-Post: -2 %
FEV1FVC-%Pred-Pre: 91 %
FEV6-%CHANGE-POST: 12 %
FEV6-%PRED-POST: 65 %
FEV6-%Pred-Pre: 58 %
FEV6-PRE: 1.94 L
FEV6-Post: 2.17 L
FEV6FVC-%CHANGE-POST: 0 %
FEV6FVC-%PRED-PRE: 106 %
FEV6FVC-%Pred-Post: 105 %
FVC-%CHANGE-POST: 12 %
FVC-%PRED-POST: 62 %
FVC-%Pred-Pre: 55 %
FVC-Post: 2.22 L
FVC-Pre: 1.98 L
POST FEV1/FVC RATIO: 62 %
POST FEV6/FVC RATIO: 98 %
Pre FEV1/FVC ratio: 64 %
Pre FEV6/FVC Ratio: 98 %

## 2018-02-01 NOTE — Progress Notes (Signed)
PFT completed today.  

## 2018-02-03 ENCOUNTER — Ambulatory Visit (INDEPENDENT_AMBULATORY_CARE_PROVIDER_SITE_OTHER)
Admission: RE | Admit: 2018-02-03 | Discharge: 2018-02-03 | Disposition: A | Discharge status: Home / Self Care | Payer: Medicare HMO | Source: Ambulatory Visit | Attending: Pulmonary Disease | Admitting: Pulmonary Disease

## 2018-02-03 ENCOUNTER — Telehealth: Payer: Self-pay | Admitting: Pulmonary Disease

## 2018-02-03 DIAGNOSIS — R918 Other nonspecific abnormal finding of lung field: Secondary | ICD-10-CM

## 2018-02-03 NOTE — Telephone Encounter (Signed)
I called patient and his son regarding CT Chest results. I shared the good news that his prior lung lesions from November have completely resolved. I suspect this was related to a very severe pneumonia. He reports his breathing is doing much better on his new inhaler regimen. We will discuss transitioning to a handheld LAMA at his next appointment.

## 2018-02-08 NOTE — Progress Notes (Signed)
Spoke with the pt's son and notified of results/recs  Pt to keep appt for further details

## 2018-02-09 ENCOUNTER — Encounter: Payer: Self-pay | Admitting: Pulmonary Disease

## 2018-02-09 ENCOUNTER — Ambulatory Visit: Payer: Medicare HMO | Admitting: Pulmonary Disease

## 2018-02-09 VITALS — BP 134/64 | HR 59 | Wt 189.4 lb

## 2018-02-09 DIAGNOSIS — J449 Chronic obstructive pulmonary disease, unspecified: Secondary | ICD-10-CM | POA: Diagnosis not present

## 2018-02-09 MED ORDER — ALBUTEROL SULFATE HFA 108 (90 BASE) MCG/ACT IN AERS: 2.0000 | INHALATION_SPRAY | RESPIRATORY_TRACT | 6 refills | Status: DC | PRN

## 2018-02-09 MED ORDER — TIOTROPIUM BROMIDE MONOHYDRATE 2.5 MCG/ACT IN AERS: 2.0000 | INHALATION_SPRAY | Freq: Every day | RESPIRATORY_TRACT | 6 refills | Status: DC

## 2018-02-09 NOTE — Patient Instructions (Signed)
Moderately severe COPD --CONTINUE Symbicort 160-4.5 mcg 2 puffs twice daily --START Spiriva Respimat 2.5 mcg 2 inhalations daily --Albuterol 2 puffs q4h as need for shortness of breath  --Duonebs as needed for shortness of breath and wheezing --Flutter valve five times a day  Abnormal CT Abnormal CT scan from 11/2017 which was repeated on 01/2018 and showed resolution of abnormalities that were likely related to severe pneumonia. --No further imaging indicated

## 2018-02-09 NOTE — Progress Notes (Signed)
Synopsis: Referred in 11/2017 for abnormal CT  Subjective:   PATIENT ID: Wesley Baker GENDER: male DOB: 1925/10/26, MRN: [id number]   HPI  Chief Complaint  Patient presents with  . Follow-up    prod cough but no worse - bending over increases SOB but otherwise he's feeling better   [name] is a 85 year old male former smoker with COPD, atrial fibrillation on Coumadin who presents for follow-up.  Last seen in clinic with me on 12/08/2017.  He was referred for abnormal CT concerning for lipoid pneumonia.  Repeat CT was done on 02/03/2017 demonstrating resolution of these abnormalities.    Since increasing his nebulizer use, he has had improvement in his dyspnea and able to walk in the grocery store with his son with less interruption. He still has stable productive cough cough and wheezing. Family previously reported concern for aspiration as he does cough while eating.  Denies any other respiratory infections last 12 months.  Denies recent ED visits/hospitalizations in the last 12 months.  Social History: 96-pack-year history.  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  Past Medical History:  Diagnosis Date  . COPD (chronic obstructive pulmonary disease) ([place])   . Dysrhythmia   . Gout   . Hypertension   . Pneumonia    recent and recurring over severalyears  . Sleep apnea      Family History  Problem Relation Age of Onset  . Diabetes Mellitus II Son   . Diabetes Son      Social History   Occupational History  . Not on file  Tobacco Use  . Smoking status: Former Smoker    Packs/day: 2.00    Years: 48.00    Pack years: 96.00    Types: Cigarettes    Last attempt to quit: 12/09/1991    Years since quitting: 26.1  . Smokeless tobacco: Never  Used  Substance and Sexual Activity  . Alcohol use: No    Frequency: Never  . Drug use: No  . Sexual activity: Not on file    No Known Allergies   Outpatient Medications Prior to Visit  Medication Sig Dispense Refill  . allopurinol (ZYLOPRIM) 300 MG tablet Take 300 mg by mouth daily.    . budesonide-formoterol (SYMBICORT) 160-4.5 MCG/ACT inhaler Inhale 2 puffs into the lungs 2 (two) times daily.    . furosemide (LASIX) 40 MG tablet Take 40 mg by mouth 2 (two) times daily.    Marland Kitchen gabapentin (NEURONTIN) 600 MG tablet Take 600 mg by mouth 3 (three) times daily.    Marland Kitchen ipratropium (ATROVENT) 0.06 % nasal spray Place 1 spray into the nose 2 (two) times daily.    Marland Kitchen ipratropium-albuterol (DUONEB) 0.5-2.5 (3) MG/3ML SOLN Inhale 3 mLs into the lungs 3 (three) times daily.     . metoprolol succinate (TOPROL-XL) 100 MG 24 hr tablet Take 50 mg by mouth daily. Only takes 50 mg currently    . warfarin (COUMADIN) 2.5 MG tablet Take 2.5 mg by mouth at bedtime.      No facility-administered medications prior to visit.     Review of Systems  Constitutional: Negative for chills, diaphoresis, fever, malaise/fatigue and weight loss.  HENT: Negative for congestion and sore throat.   Respiratory: Positive for cough, sputum production, shortness of breath (improved endurance) and wheezing. Negative for hemoptysis.  Cardiovascular: Negative for chest pain, orthopnea, leg swelling and PND.  Gastrointestinal: Negative for abdominal pain, heartburn and nausea.  Genitourinary: Negative for frequency.  Musculoskeletal: Negative for myalgias.  Skin: Negative for rash.  Neurological: Negative for dizziness, weakness and headaches.  Endo/Heme/Allergies: Does not bruise/bleed easily.     Objective:   Vitals:   02/09/18 0950  BP: 134/64  Pulse: (!) 59  SpO2: 96%  Weight: 189 lb 6.4 oz (85.9 kg)   SpO2: 96 % O2 Device: None (Room air)  Physical Exam: General: Well-appearing, no acute distress HENT: [place],  AT, OP clear, MMM Eyes: EOMI, no scleral icterus Respiratory: Clear to auscultation bilaterally.  No crackles, wheezing or rales Cardiovascular: RRR, -M/R/G, no JVD GI: BS+, soft, nontender Extremities:-Edema,-tenderness Neuro: AAO x4, CNII-XII grossly intact Skin: Intact, no rashes or bruising Psych: Normal mood, normal affect  Chest imaging: CT chest 02/03/2018-resolution of lower lobe airspace disease and effusions.  Unchanged 5 mm calcified lung nodule in the right upper lobe  CT chest 11/25/2017-bilateral nodular and masslike consolidations predominantly in the lower lobes.  Emphysema  PFT: Moderately severe obstructive defect with decreased gas exchange Imaging, labs and test noted above have been reviewed independently by me.    Assessment & Plan:   COPD  Discussion: 85 year old gentleman with COPD/emphysema and recent community-acquired pneumonia.  On our last visit he had an abnormal CT scan with repeat scan showing resolution of abnormalities.  I reviewed imaging with patient and family.  Moderately severe COPD --CONTINUE Symbicort 160-4.5 mcg 2 puffs twice daily --START Spiriva Respimat 2.5 mcg 2 inhalations daily --Albuterol 2 puffs q4h as need for shortness of breath  --Duonebs as needed for shortness of breath and wheezing --Flutter valve five times a day  Abnormal CT Abnormal CT scan from 11/2017 which was repeated on 01/2018 and showed resolution of abnormalities that were likely related to severe pneumonia. --No further imaging indicated  No orders of the defined types were placed in this encounter.  Meds ordered this encounter  Medications  . Tiotropium Bromide Monohydrate (SPIRIVA RESPIMAT) 2.5 MCG/ACT AERS    Sig: Inhale 2 puffs into the lungs daily for 30 days.    Dispense:  1 Inhaler    Refill:  6  . albuterol (PROVENTIL HFA;VENTOLIN HFA) 108 (90 Base) MCG/ACT inhaler    Sig: Inhale 2 puffs into the lungs every 4 (four) hours as needed for wheezing or  shortness of breath.    Dispense:  1 Inhaler    Refill:  6    Return in about 3 months (around 05/11/2018).  Panagiota Perfetti Rodman Pickle, MD [facility] 02/09/2018 12:55 PM  Personal pager: [phone] If unanswered, please page CCM On-call: [phone]

## 2018-02-11 DIAGNOSIS — Z96641 Presence of right artificial hip joint: Secondary | ICD-10-CM | POA: Diagnosis not present

## 2018-02-18 DIAGNOSIS — I517 Cardiomegaly: Secondary | ICD-10-CM | POA: Diagnosis not present

## 2018-02-18 DIAGNOSIS — J441 Chronic obstructive pulmonary disease with (acute) exacerbation: Secondary | ICD-10-CM | POA: Diagnosis not present

## 2018-02-18 DIAGNOSIS — R05 Cough: Secondary | ICD-10-CM | POA: Diagnosis not present

## 2018-02-18 DIAGNOSIS — J984 Other disorders of lung: Secondary | ICD-10-CM | POA: Diagnosis not present

## 2018-02-18 DIAGNOSIS — J90 Pleural effusion, not elsewhere classified: Secondary | ICD-10-CM | POA: Diagnosis not present

## 2018-02-18 DIAGNOSIS — R0989 Other specified symptoms and signs involving the circulatory and respiratory systems: Secondary | ICD-10-CM | POA: Diagnosis not present

## 2018-02-20 ENCOUNTER — Ambulatory Visit: Payer: Medicare HMO | Admitting: Pulmonary Disease

## 2018-02-20 ENCOUNTER — Ambulatory Visit (INDEPENDENT_AMBULATORY_CARE_PROVIDER_SITE_OTHER)
Admission: RE | Admit: 2018-02-20 | Discharge: 2018-02-20 | Disposition: A | Discharge status: Home / Self Care | Payer: Medicare HMO | Source: Ambulatory Visit | Attending: Pulmonary Disease | Admitting: Pulmonary Disease

## 2018-02-20 ENCOUNTER — Encounter: Payer: Self-pay | Admitting: Pulmonary Disease

## 2018-02-20 VITALS — BP 144/70 | HR 68 | Temp 97.8°F | Ht 68.0 in | Wt 185.4 lb

## 2018-02-20 DIAGNOSIS — J449 Chronic obstructive pulmonary disease, unspecified: Secondary | ICD-10-CM

## 2018-02-20 DIAGNOSIS — J181 Lobar pneumonia, unspecified organism: Secondary | ICD-10-CM | POA: Diagnosis not present

## 2018-02-20 DIAGNOSIS — J439 Emphysema, unspecified: Secondary | ICD-10-CM | POA: Diagnosis not present

## 2018-02-20 MED ORDER — IPRATROPIUM-ALBUTEROL 0.5-2.5 (3) MG/3ML IN SOLN: 3.0000 mL | Freq: Four times a day (QID) | RESPIRATORY_TRACT | Status: DC

## 2018-02-20 MED ORDER — IPRATROPIUM-ALBUTEROL 0.5-2.5 (3) MG/3ML IN SOLN
3.0000 mL | RESPIRATORY_TRACT | Status: AC
  Administered 2018-02-20: 3 mL via RESPIRATORY_TRACT

## 2018-02-20 MED ORDER — PREDNISONE 10 MG PO TABS: ORAL_TABLET | ORAL | 0 refills | Status: DC

## 2018-02-20 MED ORDER — LEVOFLOXACIN 500 MG PO TABS: 500.0000 mg | ORAL_TABLET | Freq: Every day | ORAL | 0 refills | Status: DC

## 2018-02-20 NOTE — Progress Notes (Signed)
@Patient  ID: Wesley Baker, male    DOB: 06-22-1925, 85 y.o.   MRN: [id number]  Chief Complaint  Patient presents with  . Acute Visit    Short of breath coughing    Referring provider: Maryella Shivers, MD  HPI:  85 year old male former smoker followed in our office for COPD  PMH: Gout Smoker/ Smoking History: Former smoker.  96-pack-year smoking history Maintenance: Symbicort 160, Spiriva 2.5 Pt of: Dr. Loanne Drilling  02/20/2018  - Visit   85 year old male former smoker presenting to our office today for shortness of breath and cough.  Patient is accompanied with his son.  Son reports that patient developed upper respiratory symptoms on 02/16/2018 and since then symptoms have worsened has had increased cough with increased sputum production which is usually white.  Patient presented to an urgent care 02/18/2018 and was prescribed Levaquin as well as a short prednisone taper.  Chest x-ray at urgent care per care everywhere describes a potential pneumonia.  Patient's weights have also been elevated at home primary care is having the patient take an extra Lasix at this time.  Son reporting the patient struggled to take Spiriva 2.5 and set has been doing duo nebs every 6 hours.  Son reports that patient did get better yesterday but seems to be struggling today.  Patient remains afebrile and has had a good appetite as well as drinking enough fluids.  Son does report the patient has having more shortness of breath and is unable to move around as well over the last week.  MMRC - Breathlessness Score 3 - I stop for breath after walking about 100 yards or after a few minutes on level ground (isle at grocery store is 123ft)     Tests:  11/25/2017- CT chest [facility]- multiple similar masslike nodular and bandlike foci of consolidation throughout lower lungs bilaterally appearance is most suggestive of chronic process such as multilobar lipoid pneumonia, chronic infection and neoplasm are  considered less likely but not entirely excluded, evaluate for Vicks VapoRub or mineral oil-containing substance, follow-up chest CT in 3 months, moderate emphysema with diffuse bronchial wall thickening suggesting COPD, small dependent bilateral pleural effusions, dilated main pulmonary artery suggesting pulmonary arterial hypertension  02/03/2018-CT chest without contrast-resolution of bilateral lower lobe lung consolidation since 11/25/2017, compatible with resolved inflammatory or infectious process, decreased bilateral pleural effusions now traced, emphysema, CAD  02/01/2018-pulmonary function test- FVC 1.98 (55% predicted), postbronchodilator ratio 62, postbronchodilator FEV1 1.38 (56% predicted), mid flow reversibility after bronchodilator, DLCO 38  02/18/17 -chest x-ray in urgent care- mild cardiomegaly with subtle right perihilar densities that may reflect asymmetrical edema or mild pneumonia  FENO:  No results found for: NITRICOXIDE  PFT: PFT Results Latest Ref Rng & Units 02/01/2018  FVC-Pre L 1.98  FVC-Predicted Pre % 55  FVC-Post L 2.22  FVC-Predicted Post % 62  Pre FEV1/FVC % % 64  Post FEV1/FCV % % 62  FEV1-Pre L 1.27  FEV1-Predicted Pre % 51  FEV1-Post L 1.38  DLCO UNC% % 38  DLCO COR %Predicted % 63    Imaging: Dg Chest 2 View  Result Date: 02/20/2018 CLINICAL DATA:  History of pneumonia.  Former cigarette smoker. EXAM: CHEST - 2 VIEW COMPARISON:  CT chest 11/25/2017 and 02/03/2018. PA and lateral chest 11/14/2017 and 05/06/2015. FINDINGS: Marked cardiomegaly and atherosclerosis are again seen. The lungs are emphysematous. No pneumothorax or pleural effusion. Small focus of subsegmental atelectasis left lung base noted. No acute or focal bony abnormality. IMPRESSION: No  acute disease. Cardiomegaly. Emphysema. Atherosclerosis. Electronically Signed   By: Inge Rise M.D.   On: 02/20/2018 13:16   Ct Chest Wo Contrast  Result Date: 02/03/2018 CLINICAL DATA:  85 year old  male with chronic shortness of breath. Follow-up LOWER lung opacities. EXAM: CT CHEST WITHOUT CONTRAST TECHNIQUE: Multidetector CT imaging of the chest was performed following the standard protocol without IV contrast. COMPARISON:  11/25/2017 CT and prior radiographs FINDINGS: Cardiovascular: Moderate to marked cardiomegaly again identified. Heavy coronary artery and aortic atherosclerotic calcifications again identified. There is no evidence of thoracic aortic aneurysm or pericardial effusion. Mediastinum/Nodes: No enlarged mediastinal or axillary lymph nodes. Thyroid gland, trachea, and esophagus demonstrate no significant findings. Lungs/Pleura: Areas of consolidation within the LOWER lungs on the prior study have essentially resolved. Trace bilateral pleural effusions are noted, decreased from the prior study. No suspicious mass, airspace disease or consolidation identified. Mild to moderate centrilobular emphysema is again identified. Calcified RIGHT UPPER lobe nodule again noted. Upper Abdomen: No acute abnormality Musculoskeletal: No acute or suspicious bony abnormalities. IMPRESSION: 1. Resolution of bilateral LOWER lung consolidation since 11/25/2017 CT, compatible with resolved inflammatory or infectious process. 2. Decreased bilateral pleural effusions, now trace. 3. Moderate to marked cardiomegaly and coronary artery disease. 4. Aortic Atherosclerosis (ICD10-I70.0) and Emphysema (ICD10-J43.9). Electronically Signed   By: Margarette Canada M.D.   On: 02/03/2018 15:17      Specialty Problems      Pulmonary Problems   COPD (chronic obstructive pulmonary disease) (HCC)    02/03/2018-CT chest without contrast-resolution of bilateral lower lobe lung consolidation since 11/25/2017, compatible with resolved inflammatory or infectious process, decreased bilateral pleural effusions now traced, emphysema, CAD  02/01/2018-pulmonary function test- FVC 1.98 (55% predicted), postbronchodilator ratio 62,  postbronchodilator FEV1 1.38 (56% predicted), mid flow reversibility after bronchodilator, DLCO 38       Lobar pneumonia, unspecified organism ([place])      No Known Allergies  Immunization History  Administered Date(s) Administered  . Influenza Inj Mdck Quad Pf 11/07/2017  . Influenza-Unspecified 10/25/2013    Past Medical History:  Diagnosis Date  . COPD (chronic obstructive pulmonary disease) ([place])   . Dysrhythmia   . Gout   . Hypertension   . Pneumonia    recent and recurring over severalyears  . Sleep apnea     Tobacco History: Social History   Tobacco Use  Smoking Status Former Smoker  . Packs/day: 2.00  . Years: 48.00  . Pack years: 96.00  . Types: Cigarettes  . Last attempt to quit: 12/09/1991  . Years since quitting: 26.2  Smokeless Tobacco Never Used   Counseling given: Yes  Continue to not smoke  Outpatient Encounter Medications as of 02/20/2018  Medication Sig  . albuterol (PROVENTIL HFA;VENTOLIN HFA) 108 (90 Base) MCG/ACT inhaler Inhale 2 puffs into the lungs every 4 (four) hours as needed for wheezing or shortness of breath.  . allopurinol (ZYLOPRIM) 300 MG tablet Take 300 mg by mouth daily.  . budesonide-formoterol (SYMBICORT) 160-4.5 MCG/ACT inhaler Inhale 2 puffs into the lungs 2 (two) times daily.  . furosemide (LASIX) 40 MG tablet Take 40 mg by mouth 2 (two) times daily.  Marland Kitchen gabapentin (NEURONTIN) 600 MG tablet Take 600 mg by mouth 3 (three) times daily.  Marland Kitchen ipratropium (ATROVENT) 0.06 % nasal spray Place 1 spray into the nose 2 (two) times daily.  Marland Kitchen ipratropium-albuterol (DUONEB) 0.5-2.5 (3) MG/3ML SOLN Inhale 3 mLs into the lungs 3 (three) times daily.   Marland Kitchen levofloxacin (LEVAQUIN) 500 MG tablet Take  1 tablet by mouth daily.  . metoprolol succinate (TOPROL-XL) 100 MG 24 hr tablet Take 50 mg by mouth daily. Only takes 50 mg currently  . warfarin (COUMADIN) 2.5 MG tablet Take 2.5 mg by mouth at bedtime.   . [DISCONTINUED] predniSONE (DELTASONE) 5 MG  tablet Take by mouth daily. TAPER AS DIRECTED  . levofloxacin (LEVAQUIN) 500 MG tablet Take 1 tablet (500 mg total) by mouth daily.  . predniSONE (DELTASONE) 10 MG tablet 4 tabs for 2 days, then 3 tabs for 2 days, 2 tabs for 2 days, then 1 tab for 2 days, then stop  . Tiotropium Bromide Monohydrate (SPIRIVA RESPIMAT) 2.5 MCG/ACT AERS Inhale 2 puffs into the lungs daily for 30 days. (Patient not taking: Reported on 02/20/2018)  . [EXPIRED] ipratropium-albuterol (DUONEB) 0.5-2.5 (3) MG/3ML nebulizer solution 3 mL   . [DISCONTINUED] ipratropium-albuterol (DUONEB) 0.5-2.5 (3) MG/3ML nebulizer solution 3 mL    No facility-administered encounter medications on file as of 02/20/2018.      Review of Systems  Review of Systems  Constitutional: Positive for activity change and fatigue. Negative for chills, fever and unexpected weight change.  HENT: Negative for congestion, sinus pressure, sinus pain and sore throat.   Eyes: Negative.   Respiratory: Positive for cough, shortness of breath and wheezing.   Cardiovascular: Positive for leg swelling. Negative for chest pain and palpitations.  Gastrointestinal: Negative for diarrhea, nausea and vomiting.  Endocrine: Negative.   Musculoskeletal: Negative.   Skin: Negative.   Neurological: Negative for dizziness and headaches.  Psychiatric/Behavioral: Negative.  Negative for dysphoric mood. The patient is not nervous/anxious.   All other systems reviewed and are negative.    Physical Exam  BP (!) 144/70 (BP Location: Right Arm, Cuff Size: Normal)   Pulse 68   Temp 97.8 F (36.6 C) (Oral)   Ht 5\' 8"  (1.727 m)   Wt 185 lb 6.4 oz (84.1 kg)   SpO2 97%   BMI 28.19 kg/m   Wt Readings from Last 5 Encounters:  02/20/18 185 lb 6.4 oz (84.1 kg)  02/09/18 189 lb 6.4 oz (85.9 kg)  12/08/17 184 lb 12.8 oz (83.8 kg)  04/13/17 179 lb (81.2 kg)  03/10/17 182 lb (82.6 kg)     Physical Exam  Constitutional: He is oriented to person, place, and time and  well-developed, well-nourished, and in no distress. No distress.  HENT:  Head: Normocephalic and atraumatic.  Right Ear: Hearing, tympanic membrane, external ear and ear canal normal.  Left Ear: Hearing, tympanic membrane, external ear and ear canal normal.  Nose: Nose normal. Right sinus exhibits no maxillary sinus tenderness and no frontal sinus tenderness. Left sinus exhibits no maxillary sinus tenderness and no frontal sinus tenderness.  Mouth/Throat: Uvula is midline.  Eyes: Pupils are equal, round, and reactive to light.  Neck: Normal range of motion. Neck supple.  Cardiovascular: Normal rate, regular rhythm and normal heart sounds.  Pulmonary/Chest: Effort normal. No accessory muscle usage. No respiratory distress. He has no decreased breath sounds. He has wheezes (Inspiratory and expiratory wheezes). He has rhonchi (Expiratory rhonchi which partially clear with coughing).  + Noisy chest  Musculoskeletal: Normal range of motion.        General: Edema (2+ lower extremity swelling with compression stockings applied) present.  Lymphadenopathy:    He has no cervical adenopathy.  Neurological: He is alert and oriented to person, place, and time. Gait normal.  Skin: Skin is warm and dry. He is not diaphoretic. No erythema.  Psychiatric:  Mood, memory, affect and judgment normal.  Nursing note and vitals reviewed.   DuoNeb in office today- resolved inspiratory wheeze, improved expiratory wheeze, persisting rhonchi  Lab Results:  CBC    Component Value Date/Time   WBC 8.3 03/12/2017 0442   RBC 4.02 (L) 03/12/2017 0442   HGB 12.0 (L) 03/12/2017 0442   HCT 37.2 (L) 03/12/2017 0442   PLT 180 03/12/2017 0442   MCV 92.5 03/12/2017 0442   MCH 29.9 03/12/2017 0442   MCHC 32.3 03/12/2017 0442   RDW 14.8 03/12/2017 0442   LYMPHSABS 0.3 (L) 03/10/2017 0412   MONOABS 0.6 03/10/2017 0412   EOSABS 0.0 03/10/2017 0412   BASOSABS 0.0 03/10/2017 0412    BMET    Component Value Date/Time    NA 138 03/11/2017 0814   K 3.9 03/11/2017 0814   CL 100 (L) 03/11/2017 0814   CO2 27 03/11/2017 0814   GLUCOSE 143 (H) 03/11/2017 0814   BUN 32 (H) 03/11/2017 0814   CREATININE 1.19 03/11/2017 0814   CALCIUM 8.9 03/11/2017 0814   GFRNONAA 52 (L) 03/11/2017 0814   GFRAA >60 03/11/2017 0814    BNP No results found for: BNP  ProBNP No results found for: PROBNP    Assessment & Plan:   Discussed with patient as well as son in office today that we need to keep a close monitor on patient's symptoms.  As he is quite frail and lung sounds have worsened over the past 2 to 3 weeks.  Also mentioned to them he would not be abnormal to consider admission or referral to an emergency room today.  With patient having a minor improvement yesterday on Levaquin as well as improvement with duo nebs will get chest x-ray today and extend antibiotic coverage at this time.  We will have patient have a close follow-up with our office to ensure symptoms are improving.  If symptoms are not improving patient and son know to present to the emergency room and contact our office.  Lobar pneumonia, unspecified organism [facility]) Assessment: Per care everywhere chest x-ray considers a mild pneumonia in the differential from urgent care Abnormal lung sounds today Persistent expiratory wheeze after DuoNeb today  Plan: Obtain chest x-ray today DuoNeb today Close follow-up with our office   COPD (chronic obstructive pulmonary disease) ([place]) Assessment: Frail elderly male mMRC 3 Former smoker January/2020 CT showing emphysema January/2020 pulmonary function test showing COPD Gold 2 with DLCO 38 Suspected COPD exacerbation today in office URI symptoms 3 days before symptoms worsening Inspiratory and expiratory wheeze on exam today Expiratory rhonchi which partially clear with coughing  Plan: Chest x-ray today DuoNeb today Will extend Levaquin prescription for 7 days Additional prednisone taper Continue  Symbicort 160 Restart Spiriva 2.5 Can continue to use duo nebs every 6 hours as needed for shortness of breath and wheezing Monitor hydration as well as diet in the short-term Follow-up with our office closely If symptoms worsen please present to an emergency room for further evaluation      Lauraine Rinne, NP 02/20/2018   This appointment was 45 min long with over 50% of the time in direct face-to-face patient care, assessment, plan of care, and follow-up.

## 2018-02-20 NOTE — Progress Notes (Signed)
Please contact the patient and let them know that chest x-ray results of come back.  Not showing a pneumonia.  This is good news.  It appears that what they may have considered pneumonia at urgent care is probably atelectasis or scarring.  No changes in plan of care at this time.    I still have the recommendation is to continue to monitor symptoms if symptoms are not improving under this current recommendations please present to an emergency room for further evaluation.  Keep close follow-up with our office.  [name], FNP

## 2018-02-20 NOTE — Assessment & Plan Note (Addendum)
Assessment: Frail elderly male mMRC 3 Former smoker January/2020 CT showing emphysema January/2020 pulmonary function test showing COPD Gold 2 with DLCO 38 Suspected COPD exacerbation today in office URI symptoms 3 days before symptoms worsening Inspiratory and expiratory wheeze on exam today Expiratory rhonchi which partially clear with coughing  Plan: Chest x-ray today DuoNeb today Will extend Levaquin prescription for 7 days Additional prednisone taper Continue Symbicort 160 Restart Spiriva 2.5 Can continue to use duo nebs every 6 hours as needed for shortness of breath and wheezing Monitor hydration as well as diet in the short-term Follow-up with our office closely If symptoms worsen please present to an emergency room for further evaluation

## 2018-02-20 NOTE — Patient Instructions (Addendum)
DuoNeb in office today  Chest x-ray today  Prednisone 10mg  tablet  >>>4 tabs for 2 days, then 3 tabs for 2 days, 2 tabs for 2 days, then 1 tab for 2 days, then stop >>>take with food  >>>take in the morning  >>> This replaces the prednisone given to you at urgent care  Levaquin 500mg  tablet >>> Take 1 500 mg tablet daily for the next 7 days >>> Take with food >>> start probiotic for good gut health >>>avoid rigorous exercise for next 2 weeks  >>>I sent in 2 extra days of this antibiotic  Notify the Coumadin clinic that you are on antibiotic  Continue Symbicort 160 >>> 2 puffs in the morning right when you wake up, rinse out your mouth after use, 12 hours later 2 puffs, rinse after use >>> Take this daily, no matter what >>> This is not a rescue inhaler   Resume Spiriva Respimat 2.5 >>> 2 puffs daily >>> Do this every day >>>This is not a rescue inhaler     Follow-up with our office later this week or the beginning of next week  If symptoms worsen or oxygenation drops please present to an emergency room      It is flu season:   >>>Remember to be washing your hands regularly, using hand sanitizer, be careful to use around herself with has contact with people who are sick will increase her chances of getting sick yourself. >>> Best ways to protect herself from the flu: Receive the yearly flu vaccine, practice good hand hygiene washing with soap and also using hand sanitizer when available, eat a nutritious meals, get adequate rest, hydrate appropriately   Please contact the office if your symptoms worsen or you have concerns that you are not improving.   Thank you for choosing [facility] for your healthcare, and for allowing [place] to partner with you on your healthcare journey. I am thankful to be able to provide care to you today.   [name] FNP-C

## 2018-02-20 NOTE — Assessment & Plan Note (Addendum)
Assessment: Per care everywhere chest x-ray considers a mild pneumonia in the differential from urgent care Abnormal lung sounds today Persistent expiratory wheeze after DuoNeb today  Plan: Obtain chest x-ray today DuoNeb today Close follow-up with our office

## 2018-02-21 ENCOUNTER — Encounter: Payer: Self-pay | Admitting: Pulmonary Disease

## 2018-02-21 ENCOUNTER — Telehealth: Payer: Self-pay | Admitting: Pulmonary Disease

## 2018-02-21 NOTE — Progress Notes (Signed)
See telephone note from 02/21/2018.  Nothing further is needed.

## 2018-02-21 NOTE — Telephone Encounter (Signed)
Notes recorded by Riley Kill, CMA on 02/21/2018 at 10:46 AM EST Called and spoke with patients son [name]. Made jeff aware of results. He stated that he would like to speak with Aaron Edelman directly about this. When patient was last seen for a scan he was told that the scarring they found was a rare form of pneumonia. He would like Aaron Edelman to give him a call. Will leave open and route to Aaron Edelman  Will route message to [name], NP to follow up

## 2018-02-21 NOTE — Telephone Encounter (Signed)
02/21/2018 1211  Contacted patient as well as patient's son [name] regarding chest x-ray results.  Reviewed chest x-ray results as well as recent CT results with [name] patient son and main caretaker.  Patient is about the same from last office visit which was 02/20/2018.  Patient still with raspy cough.  Oxygenation is actually better today around 92 to 95% on room air.  Patient still having shortness of breath and dyspnea.  Patient continues to be afebrile.  Patient is eating well and tolerating liquids well.  Current plan is we will stay the course of antibiotics, prednisone, monitoring patient symptoms closely.  If patient is not improving or showing signs of improvement by Thursday (02/23/2018) patient and patient son to consider admission to emergency department for further evaluation.  As patient is at maximum therapies from an outpatient standpoint for pulmonary management.  Patient is still maintained on Symbicort 160, Spiriva 2.5, as well as doing DuoNeb's about 3-4 times a day.  Will route note to Dr. Loanne Drilling as Juluis Rainier.  If she has any other thoughts regarding the patient I can add that to the plan of care and follow-up with the patient.  [name], FNP

## 2018-02-22 DIAGNOSIS — I4891 Unspecified atrial fibrillation: Secondary | ICD-10-CM | POA: Diagnosis not present

## 2018-02-22 DIAGNOSIS — Z5181 Encounter for therapeutic drug level monitoring: Secondary | ICD-10-CM | POA: Diagnosis not present

## 2018-02-22 DIAGNOSIS — J441 Chronic obstructive pulmonary disease with (acute) exacerbation: Secondary | ICD-10-CM | POA: Diagnosis not present

## 2018-02-22 DIAGNOSIS — Z7901 Long term (current) use of anticoagulants: Secondary | ICD-10-CM | POA: Diagnosis not present

## 2018-02-24 NOTE — Telephone Encounter (Signed)
Agree with plan. No additional recommendations.  Rodman Pickle, M.D. [facility] Pulmonary/Critical Care Medicine Pager: [phone] After hours pager: [phone]

## 2018-02-26 NOTE — Progress Notes (Signed)
@Patient  ID: Wesley Baker, male    DOB: 11-24-1925, 85 y.o.   MRN: [id number]  Chief Complaint  Patient presents with  . Follow-up    COPD follow-up, 1 week follow-up from COPD exacerbation    Referring provider: Maryella Shivers, MD  HPI:  85 year old male former smoker followed in our office for COPD  PMH: Gout Smoker/ Smoking History: Former smoker.  96-pack-year smoking history Maintenance: Symbicort 160, Spiriva 2.5 Pt of: Dr. Loanne Drilling   02/27/2018  - Visit   85 year old male former smoker presenting to our office today for a one-week follow-up visit.  Patient presenting today with his son.  They report the patient's symptoms have clinically improved over the last 3 to 4 days.  Patient has been maintained on a prednisone taper as well as an extended Levaquin prescription for 7 days.  Son also reporting that lower extremity swelling has improved and is just managed by primary care as well as a diuretic treatment plan.  Patient and son report adherence to Symbicort 160 as well as Spiriva Respimat 2.5.  Patient does use DuoNeb nebulized medications 1-2 times a day as needed for shortness of breath and wheezing.  MMRC - Breathlessness Score 2 - on level ground, I walk slower than people of the same age because of breathlessness, or have to stop for breathe when walking to my own pace     Tests:  11/25/2017- CT chest [place] health- multiple similar masslike nodular and bandlike foci of consolidation throughout lower lungs bilaterally appearance is most suggestive of chronic process such as multilobar lipoid pneumonia, chronic infection and neoplasm are considered less likely but not entirely excluded, evaluate for Vicks VapoRub or mineral oil-containing substance, follow-up chest CT in 3 months, moderate emphysema with diffuse bronchial wall thickening suggesting COPD, small dependent bilateral pleural effusions, dilated main pulmonary artery suggesting pulmonary arterial  hypertension  02/03/2018-CT chest without contrast-resolution of bilateral lower lobe lung consolidation since 11/25/2017, compatible with resolved inflammatory or infectious process, decreased bilateral pleural effusions now traced, emphysema, CAD  02/01/2018-pulmonary function test- FVC 1.98 (55% predicted), postbronchodilator ratio 62, postbronchodilator FEV1 1.38 (56% predicted), mid flow reversibility after bronchodilator, DLCO 38  02/18/17 -chest x-ray in urgent care- mild cardiomegaly with subtle right perihilar densities that may reflect asymmetrical edema or mild pneumonia   FENO:  No results found for: NITRICOXIDE  PFT: PFT Results Latest Ref Rng & Units 02/01/2018  FVC-Pre L 1.98  FVC-Predicted Pre % 55  FVC-Post L 2.22  FVC-Predicted Post % 62  Pre FEV1/FVC % % 64  Post FEV1/FCV % % 62  FEV1-Pre L 1.27  FEV1-Predicted Pre % 51  FEV1-Post L 1.38  DLCO UNC% % 38  DLCO COR %Predicted % 63    Imaging: Dg Chest 2 View  Result Date: 02/20/2018 CLINICAL DATA:  History of pneumonia.  Former cigarette smoker. EXAM: CHEST - 2 VIEW COMPARISON:  CT chest 11/25/2017 and 02/03/2018. PA and lateral chest 11/14/2017 and 05/06/2015. FINDINGS: Marked cardiomegaly and atherosclerosis are again seen. The lungs are emphysematous. No pneumothorax or pleural effusion. Small focus of subsegmental atelectasis left lung base noted. No acute or focal bony abnormality. IMPRESSION: No acute disease. Cardiomegaly. Emphysema. Atherosclerosis. Electronically Signed   By: Inge Rise M.D.   On: 02/20/2018 13:16   Ct Chest Wo Contrast  Result Date: 02/03/2018 CLINICAL DATA:  85 year old male with chronic shortness of breath. Follow-up LOWER lung opacities. EXAM: CT CHEST WITHOUT CONTRAST TECHNIQUE: Multidetector CT imaging of the chest was performed  following the standard protocol without IV contrast. COMPARISON:  11/25/2017 CT and prior radiographs FINDINGS: Cardiovascular: Moderate to marked cardiomegaly  again identified. Heavy coronary artery and aortic atherosclerotic calcifications again identified. There is no evidence of thoracic aortic aneurysm or pericardial effusion. Mediastinum/Nodes: No enlarged mediastinal or axillary lymph nodes. Thyroid gland, trachea, and esophagus demonstrate no significant findings. Lungs/Pleura: Areas of consolidation within the LOWER lungs on the prior study have essentially resolved. Trace bilateral pleural effusions are noted, decreased from the prior study. No suspicious mass, airspace disease or consolidation identified. Mild to moderate centrilobular emphysema is again identified. Calcified RIGHT UPPER lobe nodule again noted. Upper Abdomen: No acute abnormality Musculoskeletal: No acute or suspicious bony abnormalities. IMPRESSION: 1. Resolution of bilateral LOWER lung consolidation since 11/25/2017 CT, compatible with resolved inflammatory or infectious process. 2. Decreased bilateral pleural effusions, now trace. 3. Moderate to marked cardiomegaly and coronary artery disease. 4. Aortic Atherosclerosis (ICD10-I70.0) and Emphysema (ICD10-J43.9). Electronically Signed   By: Margarette Canada M.D.   On: 02/03/2018 15:17      Specialty Problems      Pulmonary Problems   GOLD COPD II B    02/03/2018-CT chest without contrast-resolution of bilateral lower lobe lung consolidation since 11/25/2017, compatible with resolved inflammatory or infectious process, decreased bilateral pleural effusions now traced, emphysema, CAD  02/01/2018-pulmonary function test- FVC 1.98 (55% predicted), postbronchodilator ratio 62, postbronchodilator FEV1 1.38 (56% predicted), mid flow reversibility after bronchodilator, DLCO 38       Lobar pneumonia, unspecified organism ([place])      No Known Allergies  Immunization History  Administered Date(s) Administered  . Influenza Inj Mdck Quad Pf 11/07/2017  . Influenza-Unspecified 10/25/2013   Will contact patient's primary care to update  pneumonia vaccines  Past Medical History:  Diagnosis Date  . COPD (chronic obstructive pulmonary disease) ([place])   . Dysrhythmia   . Gout   . Hypertension   . Pneumonia    recent and recurring over severalyears  . Sleep apnea     Tobacco History: Social History   Tobacco Use  Smoking Status Former Smoker  . Packs/day: 2.00  . Years: 48.00  . Pack years: 96.00  . Types: Cigarettes  . Last attempt to quit: 12/09/1991  . Years since quitting: 26.2  Smokeless Tobacco Never Used   Counseling given: Not Answered  Continue to not smoke  Outpatient Encounter Medications as of 02/27/2018  Medication Sig  . albuterol (PROVENTIL HFA;VENTOLIN HFA) 108 (90 Base) MCG/ACT inhaler Inhale 2 puffs into the lungs every 4 (four) hours as needed for wheezing or shortness of breath.  . allopurinol (ZYLOPRIM) 300 MG tablet Take 300 mg by mouth daily.  . budesonide-formoterol (SYMBICORT) 160-4.5 MCG/ACT inhaler Inhale 2 puffs into the lungs 2 (two) times daily.  . furosemide (LASIX) 40 MG tablet Take 40 mg by mouth 2 (two) times daily.  Marland Kitchen gabapentin (NEURONTIN) 600 MG tablet Take 600 mg by mouth 3 (three) times daily.  Marland Kitchen ipratropium (ATROVENT) 0.06 % nasal spray Place 1 spray into the nose 2 (two) times daily.  Marland Kitchen ipratropium-albuterol (DUONEB) 0.5-2.5 (3) MG/3ML SOLN Inhale 3 mLs into the lungs 3 (three) times daily.   . metoprolol succinate (TOPROL-XL) 100 MG 24 hr tablet Take 50 mg by mouth daily. Only takes 50 mg currently  . predniSONE (DELTASONE) 10 MG tablet 4 tabs for 2 days, then 3 tabs for 2 days, 2 tabs for 2 days, then 1 tab for 2 days, then stop  . Tiotropium Bromide Monohydrate (  SPIRIVA RESPIMAT) 2.5 MCG/ACT AERS Inhale 2 puffs into the lungs daily for 30 days.  Marland Kitchen warfarin (COUMADIN) 2.5 MG tablet Take 2.5 mg by mouth at bedtime.   . [DISCONTINUED] levofloxacin (LEVAQUIN) 500 MG tablet Take 1 tablet (500 mg total) by mouth daily. (Patient not taking: Reported on 02/27/2018)   No  facility-administered encounter medications on file as of 02/27/2018.      Review of Systems  Review of Systems  Constitutional: Positive for fatigue (improved). Negative for activity change, chills, fever and unexpected weight change.  HENT: Negative for postnasal drip, sinus pressure, sinus pain and sore throat.   Eyes: Negative.   Respiratory: Positive for cough (productive cough - yellow, improved from last ov) and shortness of breath (improved over last ov). Negative for wheezing.   Cardiovascular: Positive for leg swelling. Negative for chest pain and palpitations.  Gastrointestinal: Negative for constipation, diarrhea, nausea and vomiting.  Endocrine: Negative.   Musculoskeletal: Negative.   Skin: Negative.   Neurological: Negative for dizziness and headaches.  Psychiatric/Behavioral: Negative.  Negative for dysphoric mood. The patient is not nervous/anxious.   All other systems reviewed and are negative.    Physical Exam  BP 122/64 (BP Location: Left Arm, Cuff Size: Normal)   Pulse 85   Ht 5\' 8"  (1.727 m)   Wt 185 lb 3.2 oz (84 kg)   SpO2 98%   BMI 28.16 kg/m   Wt Readings from Last 5 Encounters:  02/27/18 185 lb 3.2 oz (84 kg)  02/20/18 185 lb 6.4 oz (84.1 kg)  02/09/18 189 lb 6.4 oz (85.9 kg)  12/08/17 184 lb 12.8 oz (83.8 kg)  04/13/17 179 lb (81.2 kg)    Physical Exam  Constitutional: He is oriented to person, place, and time and well-developed, well-nourished, and in no distress. Vital signs are normal. No distress.  + Frail elderly male  HENT:  Head: Normocephalic and atraumatic.  Right Ear: Hearing, tympanic membrane, external ear and ear canal normal.  Left Ear: Hearing, tympanic membrane, external ear and ear canal normal.  Nose: Nose normal. Right sinus exhibits no maxillary sinus tenderness and no frontal sinus tenderness. Left sinus exhibits no maxillary sinus tenderness and no frontal sinus tenderness.  Mouth/Throat: Uvula is midline and oropharynx is  clear and moist. No oropharyngeal exudate.  Eyes: Pupils are equal, round, and reactive to light.  Neck: Normal range of motion. Neck supple. No JVD present.  Cardiovascular: Normal rate, regular rhythm and normal heart sounds.  Pulmonary/Chest: Effort normal. No accessory muscle usage. No respiratory distress. He has no decreased breath sounds. He has wheezes (End expiratory wheeze). He has no rhonchi.  Musculoskeletal: Normal range of motion.        General: Edema (2+ lower extremity swelling with compression stockings applied) present.  Lymphadenopathy:    He has no cervical adenopathy.  Neurological: He is alert and oriented to person, place, and time. Gait normal.  Skin: Skin is warm and dry. He is not diaphoretic. No erythema.  Psychiatric: Mood, memory, affect and judgment normal.  Nursing note and vitals reviewed.     Lab Results:  CBC    Component Value Date/Time   WBC 8.3 03/12/2017 0442   RBC 4.02 (L) 03/12/2017 0442   HGB 12.0 (L) 03/12/2017 0442   HCT 37.2 (L) 03/12/2017 0442   PLT 180 03/12/2017 0442   MCV 92.5 03/12/2017 0442   MCH 29.9 03/12/2017 0442   MCHC 32.3 03/12/2017 0442   RDW 14.8 03/12/2017 0442  LYMPHSABS 0.3 (L) 03/10/2017 0412   MONOABS 0.6 03/10/2017 0412   EOSABS 0.0 03/10/2017 0412   BASOSABS 0.0 03/10/2017 0412    BMET    Component Value Date/Time   NA 138 03/11/2017 0814   K 3.9 03/11/2017 0814   CL 100 (L) 03/11/2017 0814   CO2 27 03/11/2017 0814   GLUCOSE 143 (H) 03/11/2017 0814   BUN 32 (H) 03/11/2017 0814   CREATININE 1.19 03/11/2017 0814   CALCIUM 8.9 03/11/2017 0814   GFRNONAA 52 (L) 03/11/2017 0814   GFRAA >60 03/11/2017 0814    BNP No results found for: BNP  ProBNP No results found for: PROBNP    Assessment & Plan:    GOLD COPD II B Assessment: January/2020 PFT shows an FEV1 of 1.38 Gold COPD 2B  DLCO 38 MMRC 2  Maintained on Symbicort 160 and Spiriva 2.5 Clinical improvement from last office visit last  week Finished prednisone taper as well as Levaquin prescription End expiratory wheeze on exam today  Plan: Continue Symbicort 160 twice daily Continue Spiriva Respimat 2.5 daily Continue Atrovent nasal spray as needed for nasal congestion Can use rescue inhaler or DuoNeb nebulized solution every 6-8 hours as needed for shortness of breath and wheezing Will contact patient's PCP to update pneumonia vaccine information Keep follow-up with Dr. Loanne Drilling in April/2020  Former smoker Plan: Continue to not smoke     Lauraine Rinne, NP 02/27/2018   This appointment was 28 min long with over 50% of the time in direct face-to-face patient care, assessment, plan of care, and follow-up.

## 2018-02-27 ENCOUNTER — Ambulatory Visit: Payer: Medicare HMO | Admitting: Pulmonary Disease

## 2018-02-27 ENCOUNTER — Encounter: Payer: Self-pay | Admitting: Pulmonary Disease

## 2018-02-27 DIAGNOSIS — J410 Simple chronic bronchitis: Secondary | ICD-10-CM

## 2018-02-27 DIAGNOSIS — Z87891 Personal history of nicotine dependence: Secondary | ICD-10-CM | POA: Diagnosis not present

## 2018-02-27 NOTE — Assessment & Plan Note (Addendum)
Assessment: January/2020 PFT shows an FEV1 of 1.38 Gold COPD 2B  DLCO 38 MMRC 2  Maintained on Symbicort 160 and Spiriva 2.5 Clinical improvement from last office visit last week Finished prednisone taper as well as Levaquin prescription End expiratory wheeze on exam today  Plan: Continue Symbicort 160 twice daily Continue Spiriva Respimat 2.5 daily Continue Atrovent nasal spray as needed for nasal congestion Can use rescue inhaler or DuoNeb nebulized solution every 6-8 hours as needed for shortness of breath and wheezing Will contact patient's PCP to update pneumonia vaccine information Keep follow-up with Dr. Loanne Drilling in April/2020

## 2018-02-27 NOTE — Assessment & Plan Note (Signed)
Plan: Continue to not smoke 

## 2018-02-27 NOTE — Patient Instructions (Addendum)
Continue Symbicort 160 >>> 2 puffs in the morning right when you wake up, rinse out your mouth after use, 12 hours later 2 puffs, rinse after use >>> Take this daily, no matter what >>> This is not a rescue inhaler   Stiolto Respimat inhaler >>>2 puffs daily >>>Take this no matter what >>>This is not a rescue inhaler  Can use duo nebs her rescue inhaler every 6-8 hours as needed for shortness of breath or wheezing  Continue Atrovent nasal spray as needed for congestion and postnasal drip    Note your daily symptoms > remember "red flags" for COPD:   >>>Increase in cough >>>increase in sputum production >>>increase in shortness of breath or activity  intolerance.   If you notice these symptoms, please call the office to be seen.        Continue fluid pill management and managing daily weights and follow-up with primary care if your retaining fluid  Keep follow-up with Dr. Loanne Drilling in April/2020       It is flu season:   >>>Remember to be washing your hands regularly, using hand sanitizer, be careful to use around herself with has contact with people who are sick will increase her chances of getting sick yourself. >>> Best ways to protect herself from the flu: Receive the yearly flu vaccine, practice good hand hygiene washing with soap and also using hand sanitizer when available, eat a nutritious meals, get adequate rest, hydrate appropriately   Please contact the office if your symptoms worsen or you have concerns that you are not improving.   Thank you for choosing [facility] for your healthcare, and for allowing [place] to partner with you on your healthcare journey. I am thankful to be able to provide care to you today.   [name] FNP-C

## 2018-03-03 DIAGNOSIS — Z139 Encounter for screening, unspecified: Secondary | ICD-10-CM | POA: Diagnosis not present

## 2018-03-03 DIAGNOSIS — Z6830 Body mass index (BMI) 30.0-30.9, adult: Secondary | ICD-10-CM | POA: Diagnosis not present

## 2018-03-03 DIAGNOSIS — H6123 Impacted cerumen, bilateral: Secondary | ICD-10-CM | POA: Diagnosis not present

## 2018-03-14 DIAGNOSIS — Z96641 Presence of right artificial hip joint: Secondary | ICD-10-CM | POA: Diagnosis not present

## 2018-03-16 DIAGNOSIS — Z6830 Body mass index (BMI) 30.0-30.9, adult: Secondary | ICD-10-CM | POA: Diagnosis not present

## 2018-03-16 DIAGNOSIS — Z139 Encounter for screening, unspecified: Secondary | ICD-10-CM | POA: Diagnosis not present

## 2018-03-16 DIAGNOSIS — Z1331 Encounter for screening for depression: Secondary | ICD-10-CM | POA: Diagnosis not present

## 2018-03-16 DIAGNOSIS — Z0000 Encounter for general adult medical examination without abnormal findings: Secondary | ICD-10-CM | POA: Diagnosis not present

## 2018-03-16 DIAGNOSIS — H6123 Impacted cerumen, bilateral: Secondary | ICD-10-CM | POA: Diagnosis not present

## 2018-03-22 ENCOUNTER — Other Ambulatory Visit: Payer: Self-pay

## 2018-03-22 ENCOUNTER — Telehealth: Payer: Self-pay | Admitting: Pulmonary Disease

## 2018-03-22 DIAGNOSIS — J449 Chronic obstructive pulmonary disease, unspecified: Secondary | ICD-10-CM

## 2018-03-22 MED ORDER — TIOTROPIUM BROMIDE MONOHYDRATE 2.5 MCG/ACT IN AERS: 2.0000 | INHALATION_SPRAY | Freq: Every day | RESPIRATORY_TRACT | 5 refills | Status: DC

## 2018-03-22 NOTE — Telephone Encounter (Signed)
Request received from [facility] to refill Spiriva.  Verified via phone that patient wishes for spiriva RF to be sent to [facility].  Order for refill placed.

## 2018-03-29 DIAGNOSIS — Z5181 Encounter for therapeutic drug level monitoring: Secondary | ICD-10-CM | POA: Diagnosis not present

## 2018-03-29 DIAGNOSIS — I4891 Unspecified atrial fibrillation: Secondary | ICD-10-CM | POA: Diagnosis not present

## 2018-03-29 DIAGNOSIS — Z7901 Long term (current) use of anticoagulants: Secondary | ICD-10-CM | POA: Diagnosis not present

## 2018-03-29 DIAGNOSIS — J449 Chronic obstructive pulmonary disease, unspecified: Secondary | ICD-10-CM | POA: Diagnosis not present

## 2018-04-12 DIAGNOSIS — Z96641 Presence of right artificial hip joint: Secondary | ICD-10-CM | POA: Diagnosis not present

## 2018-04-28 DIAGNOSIS — I4891 Unspecified atrial fibrillation: Secondary | ICD-10-CM | POA: Diagnosis not present

## 2018-04-28 DIAGNOSIS — Z5181 Encounter for therapeutic drug level monitoring: Secondary | ICD-10-CM | POA: Diagnosis not present

## 2018-04-28 DIAGNOSIS — Z7189 Other specified counseling: Secondary | ICD-10-CM | POA: Diagnosis not present

## 2018-04-28 DIAGNOSIS — Z7901 Long term (current) use of anticoagulants: Secondary | ICD-10-CM | POA: Diagnosis not present

## 2018-05-11 ENCOUNTER — Ambulatory Visit: Payer: Medicare HMO | Admitting: Pulmonary Disease

## 2018-05-13 DIAGNOSIS — Z96641 Presence of right artificial hip joint: Secondary | ICD-10-CM | POA: Diagnosis not present

## 2018-05-29 DIAGNOSIS — Z7901 Long term (current) use of anticoagulants: Secondary | ICD-10-CM | POA: Diagnosis not present

## 2018-05-29 DIAGNOSIS — Z5181 Encounter for therapeutic drug level monitoring: Secondary | ICD-10-CM | POA: Diagnosis not present

## 2018-05-29 DIAGNOSIS — I4891 Unspecified atrial fibrillation: Secondary | ICD-10-CM | POA: Diagnosis not present

## 2018-06-30 DIAGNOSIS — I4891 Unspecified atrial fibrillation: Secondary | ICD-10-CM | POA: Diagnosis not present

## 2018-06-30 DIAGNOSIS — Z5181 Encounter for therapeutic drug level monitoring: Secondary | ICD-10-CM | POA: Diagnosis not present

## 2018-07-21 DIAGNOSIS — H43811 Vitreous degeneration, right eye: Secondary | ICD-10-CM | POA: Diagnosis not present

## 2018-07-21 DIAGNOSIS — H26491 Other secondary cataract, right eye: Secondary | ICD-10-CM | POA: Diagnosis not present

## 2018-07-21 DIAGNOSIS — H353132 Nonexudative age-related macular degeneration, bilateral, intermediate dry stage: Secondary | ICD-10-CM | POA: Diagnosis not present

## 2018-07-31 DIAGNOSIS — I4891 Unspecified atrial fibrillation: Secondary | ICD-10-CM | POA: Diagnosis not present

## 2018-07-31 DIAGNOSIS — Z6831 Body mass index (BMI) 31.0-31.9, adult: Secondary | ICD-10-CM | POA: Diagnosis not present

## 2018-07-31 DIAGNOSIS — Z5181 Encounter for therapeutic drug level monitoring: Secondary | ICD-10-CM | POA: Diagnosis not present

## 2018-07-31 DIAGNOSIS — Z7901 Long term (current) use of anticoagulants: Secondary | ICD-10-CM | POA: Diagnosis not present

## 2018-08-03 DIAGNOSIS — R251 Tremor, unspecified: Secondary | ICD-10-CM | POA: Diagnosis not present

## 2018-08-03 DIAGNOSIS — S59901A Unspecified injury of right elbow, initial encounter: Secondary | ICD-10-CM | POA: Diagnosis not present

## 2018-08-03 DIAGNOSIS — E7800 Pure hypercholesterolemia, unspecified: Secondary | ICD-10-CM | POA: Diagnosis not present

## 2018-08-03 DIAGNOSIS — Z7901 Long term (current) use of anticoagulants: Secondary | ICD-10-CM | POA: Diagnosis not present

## 2018-08-03 DIAGNOSIS — S199XXA Unspecified injury of neck, initial encounter: Secondary | ICD-10-CM | POA: Diagnosis not present

## 2018-08-03 DIAGNOSIS — M25521 Pain in right elbow: Secondary | ICD-10-CM | POA: Diagnosis not present

## 2018-08-03 DIAGNOSIS — I4891 Unspecified atrial fibrillation: Secondary | ICD-10-CM | POA: Diagnosis not present

## 2018-08-03 DIAGNOSIS — S299XXA Unspecified injury of thorax, initial encounter: Secondary | ICD-10-CM | POA: Diagnosis not present

## 2018-08-03 DIAGNOSIS — S5001XA Contusion of right elbow, initial encounter: Secondary | ICD-10-CM | POA: Diagnosis not present

## 2018-08-03 DIAGNOSIS — I10 Essential (primary) hypertension: Secondary | ICD-10-CM | POA: Diagnosis not present

## 2018-08-03 DIAGNOSIS — Z79899 Other long term (current) drug therapy: Secondary | ICD-10-CM | POA: Diagnosis not present

## 2018-08-03 DIAGNOSIS — W19XXXA Unspecified fall, initial encounter: Secondary | ICD-10-CM | POA: Diagnosis not present

## 2018-08-03 DIAGNOSIS — Z87891 Personal history of nicotine dependence: Secondary | ICD-10-CM | POA: Diagnosis not present

## 2018-08-03 DIAGNOSIS — S0990XA Unspecified injury of head, initial encounter: Secondary | ICD-10-CM | POA: Diagnosis not present

## 2018-08-03 DIAGNOSIS — J449 Chronic obstructive pulmonary disease, unspecified: Secondary | ICD-10-CM | POA: Diagnosis not present

## 2018-08-03 DIAGNOSIS — R531 Weakness: Secondary | ICD-10-CM | POA: Diagnosis not present

## 2018-08-03 DIAGNOSIS — R4781 Slurred speech: Secondary | ICD-10-CM | POA: Diagnosis not present

## 2018-08-07 DIAGNOSIS — Z6832 Body mass index (BMI) 32.0-32.9, adult: Secondary | ICD-10-CM | POA: Diagnosis not present

## 2018-08-07 DIAGNOSIS — E871 Hypo-osmolality and hyponatremia: Secondary | ICD-10-CM | POA: Diagnosis not present

## 2018-08-07 DIAGNOSIS — R4182 Altered mental status, unspecified: Secondary | ICD-10-CM | POA: Diagnosis not present

## 2018-08-07 DIAGNOSIS — Z7689 Persons encountering health services in other specified circumstances: Secondary | ICD-10-CM | POA: Diagnosis not present

## 2018-08-09 ENCOUNTER — Telehealth (INDEPENDENT_AMBULATORY_CARE_PROVIDER_SITE_OTHER): Payer: Medicare HMO | Admitting: Pulmonary Disease

## 2018-08-09 ENCOUNTER — Encounter: Payer: Self-pay | Admitting: Pulmonary Disease

## 2018-08-09 DIAGNOSIS — J449 Chronic obstructive pulmonary disease, unspecified: Secondary | ICD-10-CM

## 2018-08-09 MED ORDER — PREDNISONE 10 MG PO TABS
ORAL_TABLET | ORAL | 0 refills | Status: AC
Start: 2018-08-09 — End: 2018-08-21

## 2018-08-09 MED ORDER — DOXYCYCLINE HYCLATE 100 MG PO TABS: 100.0000 mg | ORAL_TABLET | Freq: Two times a day (BID) | ORAL | 0 refills | Status: DC

## 2018-08-09 MED ORDER — INCRUSE ELLIPTA 62.5 MCG/INH IN AEPB: 1.0000 | INHALATION_SPRAY | Freq: Every day | RESPIRATORY_TRACT | 0 refills | Status: DC

## 2018-08-09 NOTE — Assessment & Plan Note (Signed)
Assessment: January/2020 pulmonary function test shows an FEV1 of 1.38, gold COPD 2, DLCO 38 Maintained on Symbicort 160 and DuoNeb nebulized medication scheduled every 6 hours Patient unable to afford Spiriva Respimat 2.5 Worsening shortness of breath over the last 3 weeks since stopping Spiriva Respimat worsened wheezing Chest x-ray from 08/03/2018 clear with small left pleural effusion  Plan: Prednisone taper today Doxycycline today Patient and patient's son to contact primary care to adjust Coumadin dosing based off doxycycline prescription Continue Symbicort 160 Incruse Ellipta samples provided to the patient for patient's son to pick up on 08/10/2018 [facility] for medication management and help with patient assistance completion to help with cost of inhalers Close in person follow-up with Dr. Loanne Drilling next week

## 2018-08-09 NOTE — Patient Instructions (Addendum)
Prednisone 10mg  tablet  >>>4 tabs for 3 days, then 3 tabs for 3 days, 2 tabs for 3 days, then 1 tab for 3 days, then stop >>>take with food  >>>take in the morning   Doxycycline >>> 1 100 mg tablet every 12 hours for 5 days >>>take with food  >>>wear sunscreen   >>> do not start doxycycline till primary care is given you instructions about your Coumadin management  Continue Symbicort 160 >>> 2 puffs in the morning right when you wake up, rinse out your mouth after use, 12 hours later 2 puffs, rinse after use >>> Take this daily, no matter what >>> This is not a rescue inhaler   Trial of Incruse Ellipta  >>> Take 1 puff daily in the morning right when you wake up >>>Rinse your mouth out after use >>>This is a daily maintenance inhaler, NOT a rescue inhaler >>>Contact our office if you are having difficulties affording or obtaining this medication >>>It is important for you to be able to take this daily and not miss any doses    Can use duo nebs her rescue inhaler every 6-8 hours as needed for shortness of breath or wheezing  Continue Atrovent nasal spray as needed for congestion and postnasal drip    Note your daily symptoms >remember "red flags" for COPD:  >>>Increase in cough >>>increase in sputum production >>>increase in shortness of breath or activity  intolerance.   If you notice these symptoms, please call the office to be seen.     I have placed a [facility] to have a pharmacist work with you on medication management as well as the cost of your medications    Continue fluid pill management and managing daily weights and follow-up with primary care if your retaining fluid   Return in about 1 week (around 08/16/2018), or if symptoms worsen or fail to improve, for Follow up with Dr. Loanne Drilling.

## 2018-08-09 NOTE — Progress Notes (Signed)
Virtual Visit via Video Note  I connected with Wesley Baker on 08/09/18 at 11:30 AM EDT by a video enabled telemedicine application and verified that I am speaking with the correct person using two identifiers.  Location: Patient: Home Provider: Office - [facility] - [address], [address], [place], [place] [address]  I discussed the limitations of evaluation and management by telemedicine and the availability of in person appointments. The patient expressed understanding and agreed to proceed. I also discussed with the patient that there may be a patient responsible charge related to this service. The patient expressed understanding and agreed to proceed.  Patient consented to consult via telephone: Yes People present and their role in pt care: Pt   History of Present Illness: 85 year old male former smoker followed in our office for COPD  PMH: Gout Smoker/ Smoking History: Former smoker.  96-pack-year smoking history Maintenance: Symbicort 160, Spiriva 2.5 Pt of: Dr. Loanne Drilling  Chief complaint: Shortness of breath, 55 weeks  85 year old male former smoker completing a video visit today.  Patient's son who is patient's primary caregiver is also on the video visit.  They are reporting today that patient has had worsening breathing over the last 3 weeks.  He is not consistently wheezing.  They feel that he is in a COPD exacerbation.  He has been practicing social distancing.  He did have a fall last week on 08/02/2018 where he was taken to [facility] emergency room.  CT head was negative per patient son.  Chest x-ray did show a small amount of fluid on patient's lungs but no pneumonia.  Images are in PACS.  Patient continues to be maintained on Symbicort 160.  Patient stopped using his Spiriva HandiHaler 3 weeks ago due to the cost cost of Spiriva HandiHaler been $399.  They did not contact our office regarding this.  They are wondering if there is more affordable options.  Since being  off Spiriva HandiHaler patient's son has maintain patient on DuoNeb nebulized meds every 6 hours.  Patient is making more mucus production than baseline.  Mucus is clear to white.  Oxygen levels dropped to 92 when walking on room air.  Patient son wanted the video visit to be completed due to the worsening breathing as well as worsened wheezing.  Observations/Objective:  11/25/2017- CT chest [facility] health- multiple similar masslike nodular and bandlike foci of consolidation throughout lower lungs bilaterally appearance is most suggestive of chronic process such as multilobar lipoid pneumonia, chronic infection and neoplasm are considered less likely but not entirely excluded, evaluate for Vicks VapoRub or mineral oil-containing substance, follow-up chest CT in 3 months, moderate emphysema with diffuse bronchial wall thickening suggesting COPD, small dependent bilateral pleural effusions, dilated main pulmonary artery suggesting pulmonary arterial hypertension  02/03/2018-CT chest without contrast-resolution of bilateral lower lobe lung consolidation since 11/25/2017, compatible with resolved inflammatory or infectious process, decreased bilateral pleural effusions now traced, emphysema, CAD  02/01/2018-pulmonary function test- FVC 1.98 (55% predicted), postbronchodilator ratio 62, postbronchodilator FEV1 1.38 (56% predicted), mid flow reversibility after bronchodilator, DLCO 38  02/18/17 -chest x-ray in urgent care- mild cardiomegaly with subtle right perihilar densities that may reflect asymmetrical edema or mild pneumonia  08/03/2018-chest x-ray- enlargement of cardiac silhouette, bibasilar atelectasis and small left pleural effusion   Assessment and Plan:  GOLD COPD II B Assessment: January/2020 pulmonary function test shows an FEV1 of 1.38, gold COPD 2, DLCO 38 Maintained on Symbicort 160 and DuoNeb nebulized medication scheduled every 6 hours Patient unable  to afford Spiriva Respimat  2.5 Worsening shortness of breath over the last 3 weeks since stopping Spiriva Respimat worsened wheezing Chest x-ray from 08/03/2018 clear with small left pleural effusion  Plan: Prednisone taper today Doxycycline today Patient and patient's son to contact primary care to adjust Coumadin dosing based off doxycycline prescription Continue Symbicort 160 Incruse Ellipta samples provided to the patient for patient's son to pick up on 08/10/2018 [facility] for medication management and help with patient assistance completion to help with cost of inhalers Close in person follow-up with Dr. Loanne Drilling next week     Follow Up Instructions:  Return in about 1 week (around 08/16/2018), or if symptoms worsen or fail to improve, for Follow up with Dr. Loanne Drilling.    I discussed the assessment and treatment plan with the patient. The patient was provided an opportunity to ask questions and all were answered. The patient agreed with the plan and demonstrated an understanding of the instructions.   The patient was advised to call back or seek an in-person evaluation if the symptoms worsen or if the condition fails to improve as anticipated.  I provided 28 minutes of non-face-to-face time during this encounter.   Lauraine Rinne, NP

## 2018-08-14 ENCOUNTER — Other Ambulatory Visit: Payer: Self-pay | Admitting: *Deleted

## 2018-08-14 ENCOUNTER — Encounter: Payer: Self-pay | Admitting: *Deleted

## 2018-08-14 NOTE — Patient Outreach (Signed)
[facility]) Care Management  08/14/2018  Wesley Baker 05-Mar-1925 [id number]   Telephone Screen  Referral Date: 08/09/18 Referral Source: MD referral Referral Reason: COPD, Med Management   Diagnoses of COPD/ Pneumonia   Expected date of contact within 10 days   Per 08/09/18 Wesley Brooms, NP after summary- to have a pharmacist work with you on medication management as well as the cost of your medications Insurance: [place] attempt # 1 successful to [name] but he reports that he [facility] CM speak with his son, Wesley Baker whom he stays with about the MD referral for medication assistance. "I'm [age] and a little slow.  [facility] RN CM left her contact information for Wesley Baker to return a call to CM     Consent: [facility] RN CM reviewed [facility] services with patient. Patient gave verbal consent for services [facility] telephonic RN CM.   Plan: [facility] CM sent an unsuccessful outreach letter and scheduled this patient for another call attempt within 4 business days  Wesley L. Lavina Hamman, RN, BSN, [place] Coordinator Office number [phone] Mobile number [phone]  Main [facility] number [phone] Fax number [phone]

## 2018-08-14 NOTE — Patient Outreach (Addendum)
[facility]) Care Management  08/14/2018  Wesley Baker [birth year] [id number]   Telephone Screen  Referral Date: 08/09/18 Referral Source: MD referral- [name], NP  Referral Reason: COPD, Med Management   Diagnoses of COPD/ Pneumonia   Expected date of contact within 10 days   Per 08/09/18 Tonia Brooms, NP after summary- to have a pharmacist work with you on medication management as well as the cost of your medications Insurance: [place]  Patient and son, Wesley Baker returned a call to [place] Wesley Baker is able to verify HIPAA Reviewed and addressed  MD referral to [facility] with patient's son Wesley Baker  Social: [name] is single, retired and lives at the home of his son, Wesley Baker, who provides 24 hour care. Wesley [place]. He reports he provides a radio to his father to check on him at intervals. Mr Traweek is independent to assist with his ADLs. Wesley Baker assists with iADLs. He has a grandson and other family who are plumbers and carpenters that have assisted with making the home and bathroom handicap accessible . Wesley Baker assists to medical appointments prn    Conditions: Atrial fibrillation, GOLD COPD II B, hx of lobar PNA, hx of closed right hip fx, Gout, former smoker, malnutrition of moderate degree   Falls [name] fell recently without injury He had 3 falls in the last 3 months but refuses facility placement when discussed per Wesley Baker. [facility] RN CM discussed the option of [facility] community RN CM services but was informed it was not preferred. CM was informed that if they changed their minds RN CM will be contacted   DME: walker and bathroom handicap accessible, bars, ramp   Medications:  Uses [facility] for new medications but uses [employer] delivery program for routine medications  Polypharmacy med review-patient taking about 13 meds He is on Symbicort bid obtained from [facility] delivery at a cost of $55 They report that this cost is manageable   Medication assistance. Patient  unable to afford meds cost of Spiriva (in donut hole) Was on Spiriva with a cost that increased to $300/month that is not manageable He was switched and given samples on 08/09/18 for Incruse Ellipta Medication management-patient having trouble managing meds - related to his inhalers He has been started on new medication Incruse Ellipta (was give samples by MD on 08/09/18) but has not had a rx filled at this time, Questions related to potential cost Son states he wanted to ask Dr Loanne Drilling during the  July 22 20 visit for a Rx for [facility] confirms that they gave not previously applied for medication assistance programs nor had any assist for SSI for medications.    Appointments: Scheduled to see Dr Loanne Drilling on 08/16/18    Advance Directives: Has POA, Wesley Baker Not interested in changes in advance directives today   Consent: [facility] RN CM reviewed [facility] services with patient. Patient gave verbal consent for services [facility] telephonic RN CM and [facility] pharmacy.   Plan: [facility] RN CM referred Mr Malcomb to [facility] for medication management assistance with Spiriva and Incruse Ellipta. Donut hole for Spiriva Note routed to MDs/NP [facility] RN CM reviewed the letter and [facility] brochure sent via mail. Cm encouraged a return call for other services prn   Kimberly L. Lavina Hamman, RN, BSN, [place] Coordinator Office number [phone] Mobile number [phone]  Main [facility] number [phone] Fax number [phone]

## 2018-08-15 ENCOUNTER — Other Ambulatory Visit: Payer: Self-pay | Admitting: Pharmacist

## 2018-08-15 ENCOUNTER — Ambulatory Visit: Payer: Medicare HMO | Admitting: *Deleted

## 2018-08-15 NOTE — Patient Outreach (Signed)
[facility]) Care Management  [place]   08/15/2018  Wesley Baker [birth year] [id number]  Reason for referral: Medication Assistance with inhalers  Referral source: Dr. [name] Current insurance: [facility]   PMHx includes but not limited to:  COPD, former smoker 10 pack year history, atrial fibrillation on chronic anticoagulation, gout, malnutrition, several recent falls but declines SNF  Per notes, son, [name], actively involved with patient's medication mgmt, patient lives with son who provides 24 hour care.  Patient maintained on Symbicort, Spiriva.  Patient stopped taking Spiriva 3 weeks ago due to cost in coverage gap, replaced with Duonebs q6h.  However worsening SOB, AECOPD.  Virtual visit with pulmonary 7/15, started on prednisone, doxycycline, provided with samples of Incruse Ellipta.    Outreach:  Successful telephone call with patient.  HIPAA identifiers verified.   Subjective:  Patient confirmed he is now taking Incruse Ellipta and "it is going good so far."  He has been on it for almost a week.  He is not sure what the household income is but thinks his son would know. Gives permission for me to speak with [name].  [name] reports he fills a weekly pillbox with AM/PM medications and patient rarely misses medication doses.  [name] reports COPD symptoms have improved since starting steroid, antibiotic, and inhaler.  He reports patient has not had an INR check since taking doxycycline but was told by providers this would be ok as INR is very "stable."  Next INR check in early August.  Jeff reports co-pay for Symbicort has increased from $55 -->  $99 / month.    Objective: The ASCVD Risk score Wesley Bussing DC Jr., et al., 2013) failed to calculate for the following reasons:   The 2013 ASCVD risk score is only valid for ages 32 to 83  Lab Results  Component Value Date   CREATININE 1.19 03/11/2017   CREATININE 1.30 (H) 03/10/2017    No results found for:  HGBA1C  Lipid Panel  No results found for: CHOL, TRIG, HDL, CHOLHDL, VLDL, LDLCALC, LDLDIRECT  BP Readings from Last 3 Encounters:  02/27/18 122/64  02/20/18 (!) 144/70  02/09/18 134/64    No Known Allergies  Medications Reviewed Today    Reviewed by Barbaraann Faster, RN (Registered Nurse) on 08/14/18 at 1214  Med List Status: <None>  Medication Order Taking? Sig Documenting Provider Last Dose Status Informant  albuterol (PROVENTIL HFA;VENTOLIN HFA) 108 (90 Base) MCG/ACT inhaler [id number] No Inhale 2 puffs into the lungs every 4 (four) hours as needed for wheezing or shortness of breath. Margaretha Seeds, MD Taking Active   allopurinol (ZYLOPRIM) 300 MG tablet [id number] No Take 300 mg by mouth daily. [provider] Taking Active Child  budesonide-formoterol (SYMBICORT) 160-4.5 MCG/ACT inhaler [id number] No Inhale 2 puffs into the lungs 2 (two) times daily. [provider] Taking Active Child  doxycycline (VIBRA-TABS) 100 MG tablet [id number]  Take 1 tablet (100 mg total) by mouth 2 (two) times daily. Lauraine Rinne, NP  Active   furosemide (LASIX) 40 MG tablet [id number] No Take 40 mg by mouth 2 (two) times daily. [provider] Taking Active Child  gabapentin (NEURONTIN) 600 MG tablet [id number] No Take 600 mg by mouth 3 (three) times daily. [provider] Taking Active Child  ipratropium (ATROVENT) 0.06 % nasal spray [id number] No Place 1 spray into the nose 2 (two) times daily. [provider] Taking Active Child  ipratropium-albuterol (DUONEB) 0.5-2.5 (3) MG/3ML SOLN [id number] No  Inhale 3 mLs into the lungs 3 (three) times daily.  [provider] Taking Active Child  metoprolol succinate (TOPROL-XL) 100 MG 24 hr tablet [id number] No Take 50 mg by mouth daily. Only takes 50 mg currently [provider] Taking Active Child  predniSONE (DELTASONE) 10 MG tablet [id number]  Take 4 tablets (40 mg total) by mouth daily with breakfast  for 3 days, THEN 3 tablets (30 mg total) daily with breakfast for 3 days, THEN 2 tablets (20 mg total) daily with breakfast for 3 days, THEN 1 tablet (10 mg total) daily with breakfast for 3 days. Lauraine Rinne, NP  Active   Tiotropium Bromide Monohydrate (SPIRIVA RESPIMAT) 2.5 MCG/ACT AERS [id number] No Inhale 2 puffs into the lungs daily for 30 days.  Patient not taking: Reported on 08/09/2018   Margaretha Seeds, MD Not Taking Expired 04/21/18 2359   umeclidinium bromide (INCRUSE ELLIPTA) 62.5 MCG/INH AEPB [id number]  Inhale 1 puff into the lungs daily. Lauraine Rinne, NP  Active   warfarin (COUMADIN) 2.5 MG tablet [id number] No Take 2.5 mg by mouth at bedtime.  [provider] Taking Active Child           Med Note Sallee Lange, DENISE   Thu Dec 08, 2017 10:17 AM)            Assessment: Drugs sorted by system:  Neurologic/Psychologic: gabapentin  Hematologic: warfarin  Cardiovascular: furosemide, metoprolol succinate  Pulmonary/Allergy: albuterol inhaler, budesonide-formoterol inhaler, ipratropium-albuterol nebulizer, umeclidinium inhaler, ipratropium NS  Endocrine: prednisone  Miscellaneous: allopurinol  Medication Assistance Findings:  Medication assistance needs identified: Symbicort, Incruse or Spiriva  Extra Help:  Not eligible for Extra Help Low Income Subsidy based on reported income and assets  Patient Assistance Programs: Trelegy OR incruse ellipta made by [place] requirement met: Unknown o Out-of-pocket prescription expenditure met:   Unknown - Reviewed program requirements with patient.   - Son would prefer to change to an inhaler that has all 3 ingredients combined.  It is a dry powder formulation which patient prefers.    Symbicort made by [employer] o Income requirement met: Yes o Out-of-pocket prescription expenditure met:   Unknown - Reviewed program requirements with patient.    Plan: Son will look for income documents and TROOP spend to  clarify which programs patient is eligible for with inhalers.  He will call me back later this week once he has this information.  I will f/u in 1 week if I have not heard back yet.   [name], PharmD, [place] [phone]

## 2018-08-16 ENCOUNTER — Telehealth: Payer: Self-pay | Admitting: Pulmonary Disease

## 2018-08-16 ENCOUNTER — Ambulatory Visit: Payer: Medicare HMO | Admitting: Pulmonary Disease

## 2018-08-16 ENCOUNTER — Other Ambulatory Visit: Payer: Self-pay

## 2018-08-16 ENCOUNTER — Encounter: Payer: Self-pay | Admitting: Pulmonary Disease

## 2018-08-16 VITALS — BP 114/70 | HR 78 | Temp 98.1°F | Ht 70.0 in | Wt 193.6 lb

## 2018-08-16 DIAGNOSIS — J449 Chronic obstructive pulmonary disease, unspecified: Secondary | ICD-10-CM | POA: Diagnosis not present

## 2018-08-16 MED ORDER — YUPELRI 175 MCG/3ML IN SOLN: 175.0000 ug | Freq: Every day | RESPIRATORY_TRACT | 3 refills | Status: DC

## 2018-08-16 MED ORDER — UMECLIDINIUM BROMIDE 62.5 MCG/INH IN AEPB: 1.0000 | INHALATION_SPRAY | Freq: Every day | RESPIRATORY_TRACT | 3 refills | Status: DC

## 2018-08-16 MED ORDER — INCRUSE ELLIPTA 62.5 MCG/INH IN AEPB: 1.0000 | INHALATION_SPRAY | Freq: Every day | RESPIRATORY_TRACT | 0 refills | Status: DC

## 2018-08-16 MED ORDER — SPIRIVA RESPIMAT 2.5 MCG/ACT IN AERS: 2.0000 | INHALATION_SPRAY | Freq: Every day | RESPIRATORY_TRACT | 3 refills | Status: DC

## 2018-08-16 NOTE — Telephone Encounter (Signed)
Findings of benefits investigation via test claims for Yupelri, Pulmicort, Perforomist, and Brovana :   Insurance: [employer] Medicare D  Yupelri- Ran 25ml for 30 day supply- patient's copay is $231.81  Pulmicort- Ran 28ml for 30 day supply- patient's copay is $10.66  Brovana and Perforomist both require Prior Authorizations.  11:11 AM Beatriz Chancellor, CPhT

## 2018-08-16 NOTE — Telephone Encounter (Signed)
08/16/2018 [id number]  Wesley Baker, Wesley Baker,  Please see patient's last video visit note.  Patient also has had referrals to [facility] for help with medication assistance.  Looks like [name] has spoken with the patient on 08/15/2018.  Patient physically being seen in office today by Dr. Loanne Drilling on 08/16/2018.  Can we evaluate the patient's out-of-pocket cost for nebulized Yupelri to replace his Spiriva HandiHaler which she cannot afford.  It looks like [facility] as started the paperwork in process to get patient qualified for Incruse Ellipta which she received samples of last week.  Based off patient's age as well as severity of COPD he likely would benefit from all nebulized medication if financially affordable.  Would be helpful to evaluate out-of-pocket cost for Pulmicort, Brovana or Performist, and Yupelri.   [name] FNP

## 2018-08-16 NOTE — Patient Instructions (Addendum)
COPD Continue Symbicort 2 puffs twice a day Continue Incruse 1 puff once a day. Will REFILL. (If you run out of Incruse, start taking Duonebs three times a day.)  Follow-up in 3 months

## 2018-08-16 NOTE — Progress Notes (Signed)
This encounter was created in error - please disregard.

## 2018-08-16 NOTE — Progress Notes (Signed)
Subjective:   PATIENT ID: Wesley Baker GENDER: male DOB: 05-22-1925, MRN: [id number]   HPI  CC: COPD follow-up  [name] is a 85 year old male former smoker with COPD, atrial fibrillation on Coumadin who presents for follow-up. Son is present and provides majority of history.  Last seen in video visit last week with Pulmonary NP, Wesley Baker. During prior visit, he had worsening shortness of breath and wheezing since self-discontinuation of his Spiriva due to cost ($399). Patient has been using scheduled Duonebs. He was treated with prednisone and doxycline. He was also provided Incruse samples to be picked up.  Today, he reports significant improvement on the Incruse and prednisone. He has completed his steroids and antibiotics and reports dyspnea has returned to baseline. Denies shortness of breath, wheezing, chest pain, cough. Son expresses concern about cost of inhalers and requests for price check for inhalers if possible.   Social History: 96-pack-year history.  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  Past Medical History:  Diagnosis Date  . COPD (chronic obstructive pulmonary disease) ([place])   . Dysrhythmia   . Gout   . Hypertension   . Pneumonia    recent and recurring over severalyears  . Sleep apnea      Family History  Problem Relation Age of Onset  . Diabetes Mellitus II Son   . Diabetes Son      Social History   Occupational History  . Occupation: retired     Comment: lives with his son, Wesley Baker   Tobacco Use  . Smoking status: Former Smoker    Packs/day: 2.00    Years: 48.00    Pack years: 96.00    Types: Cigarettes    Quit date: 12/09/1991    Years since quitting: 26.7  . Smokeless tobacco: Never Used  Substance and Sexual  Activity  . Alcohol use: No    Frequency: Never  . Drug use: No  . Sexual activity: Not on file    No Known Allergies   Outpatient Medications Prior to Visit  Medication Sig Dispense Refill  . albuterol (PROVENTIL HFA;VENTOLIN HFA) 108 (90 Base) MCG/ACT inhaler Inhale 2 puffs into the lungs every 4 (four) hours as needed for wheezing or shortness of breath. 1 Inhaler 6  . allopurinol (ZYLOPRIM) 300 MG tablet Take 300 mg by mouth daily.    . budesonide-formoterol (SYMBICORT) 160-4.5 MCG/ACT inhaler Inhale 2 puffs into the lungs 2 (two) times daily.    . furosemide (LASIX) 40 MG tablet Take 40 mg by mouth daily. May take extra dose depending on weight    . gabapentin (NEURONTIN) 600 MG tablet Take 600 mg by mouth 3 (three) times daily.    Marland Kitchen ipratropium (ATROVENT) 0.06 % nasal spray Place 1 spray into the nose 2 (two) times daily.    Marland Kitchen ipratropium-albuterol (DUONEB) 0.5-2.5 (3) MG/3ML SOLN Inhale 3 mLs into the lungs 3 (three) times daily. Takes once in the evening    . metoprolol succinate (TOPROL-XL) 100 MG 24 hr tablet Take 50 mg by mouth daily. Only takes 50 mg currently    . predniSONE (DELTASONE) 10 MG tablet Take 4 tablets (40 mg total) by mouth daily with breakfast for 3 days, THEN 3 tablets (30 mg total) daily with breakfast for 3 days, THEN 2 tablets (20 mg total) daily with  breakfast for 3 days, THEN 1 tablet (10 mg total) daily with breakfast for 3 days. 30 tablet 0  . warfarin (COUMADIN) 2.5 MG tablet Take 2.5 mg by mouth at bedtime. 2.5mg  T/Thursday/Sat and 1.25mg  all other days    . Revefenacin (YUPELRI) 175 MCG/3ML SOLN Inhale 175 mcg into the lungs daily. 90 mL 3  . umeclidinium bromide (INCRUSE ELLIPTA) 62.5 MCG/INH AEPB Inhale 1 puff into the lungs daily. 2 each 0   No facility-administered medications prior to visit.    Review of Systems  Constitutional: Negative for chills, diaphoresis, fever, malaise/fatigue and weight loss.  HENT: Negative for congestion, ear pain and  sore throat.   Respiratory: Negative for cough, hemoptysis, sputum production, shortness of breath and wheezing.   Cardiovascular: Negative for chest pain, palpitations and leg swelling.  Gastrointestinal: Negative for abdominal pain, heartburn and nausea.  Genitourinary: Negative for frequency.  Musculoskeletal: Negative for joint pain and myalgias.  Skin: Negative for itching and rash.  Neurological: Negative for dizziness, weakness and headaches.  Endo/Heme/Allergies: Does not bruise/bleed easily.  Psychiatric/Behavioral: Negative for depression. The patient is not nervous/anxious.      Objective:   Vitals:   08/16/18 1121  BP: 114/70  Pulse: 78  Temp: 98.1 F (36.7 C)  TempSrc: Oral  SpO2: 98%  Weight: 193 lb 9.6 oz (87.8 kg)  Height: 5\' 10"  (1.778 m)   SpO2: 98 %  Physical Exam: General: Elderly-appearing, no acute distress HENT: [place], AT, OP clear, MMM, difficulty hearing Eyes: EOMI, no scleral icterus Respiratory: Clear to auscultation bilaterally.  No crackles, wheezing or rales Cardiovascular: RRR, -M/R/G, no JVD GI: BS+, soft, nontender Extremities:-Edema,-tenderness Neuro: AAO x4, CNII-XII grossly intact Skin: Intact, no rashes or bruising Psych: Normal mood, normal affect  Data Reviewed:  Chest Imaging  CT chest 02/03/2018-resolution of lower lobe airspace disease and effusions.  Unchanged 5 mm calcified lung nodule in the right upper lobe  CT chest 11/25/2017-bilateral nodular and masslike consolidations predominantly in the lower lobes.  Emphysema  CXR 02/20/18 - Emphysema, cardiomegaly. No acute infiltrate or edema.  PFT 02/01/18 FVC 2.22 (62%) FEV1 1.38 (56%) Ratio 64 DLCO 38% Interpretation: Moderately severe obstructive defect with severely reduced DLCO suggestive of emphysema  Labs CBC 03/12/17 reviewed. Hg 12.0   Imaging, labs and test noted above have been reviewed independently by me.    Assessment & Plan:   Discussion: 85 year old male  with COPD/emphysema who presents follow-up. Discussed and coordinated care with clinic pharmacist to investigate affordable bronchodilator options.  Moderately severe COPD Continue Symbicort 2 puffs twice a day Continue Incruse 1 puff once a day. Will REFILL. (If you run out of Incruse, start taking Duonebs three times a day.) \Albuterol 2 puffs q4h as need for shortness of breath  Duonebs as needed for shortness of breath and wheezing  Orders Placed This Encounter  Procedures  . AMB Referral to [place] Management    Referral Priority:   Routine    Referral Type:   Consultation    Referral Reason:   [facility]-Care Management    Number of Visits Requested:   1   Meds ordered this encounter  Medications  . DISCONTD: Tiotropium Bromide Monohydrate (SPIRIVA RESPIMAT) 2.5 MCG/ACT AERS    Sig: Inhale 2 puffs into the lungs daily.    Dispense:  4 g    Refill:  3  . umeclidinium bromide (INCRUSE ELLIPTA) 62.5 MCG/INH AEPB    Sig: Inhale 1 puff into the lungs daily.  Dispense:  30 each    Refill:  3  . umeclidinium bromide (INCRUSE ELLIPTA) 62.5 MCG/INH AEPB    Sig: Inhale 1 puff into the lungs daily.    Dispense:  4 each    Refill:  0    Order Specific Question:   Lot Number?    Answer:   [id number]    Order Specific Question:   Expiration Date?    Answer:   02/25/2019    Order Specific Question:   Manufacturer?    Answer:   GlaxoSmithKline [[id number]]    Return in about 3 months (around 11/16/2018).  Brittni Hult Rodman Pickle, MD [facility] 08/20/2018 5:54 PM  Personal pager: [phone] If unanswered, please page CCM On-call: [phone]

## 2018-08-16 NOTE — Progress Notes (Addendum)
Patient was seen today in a co-visit with Dr. Loanne Drilling. Helped with inhaler selection for COPD in consideration of device and cost for patient. Maretta Bees price quoted as $200 at pharmacy or medical supply company, possibly due to Medicare donut hole. Referral sent to [facility] for help with Incruse access (copay $91 at [facility]).  See documentation under Dr. Cordelia Pen visit for details.

## 2018-08-17 NOTE — Telephone Encounter (Signed)
I spoke with Dr. Loanne Drilling today and she reported the patient was doing well on Incruse Ellipta.  This is good news.  I would recommend that we continue to work with and support the patient as well as patient's son to ensure that there paperwork its completed with [facility] for patient assistance.  No need to consider nebulized meds at this time.  [name], FNP

## 2018-08-21 ENCOUNTER — Telehealth: Payer: Self-pay | Admitting: Pulmonary Disease

## 2018-08-21 NOTE — Telephone Encounter (Signed)
Before calling pt's pharmacy to provide them diagnosis code for the Yupelri, looking at pt's current med list, I am not seeing Yupelri on there as it looks like Maretta Bees was discontinued. There was also a phone encounter 7/22 from pharmacy staff and also [name] who discussed with Dr. Loanne Drilling and it was stated that pt was dong well in Incruse. Due to this, no need to consider nebulized meds at this time.  Called pt's pharmacy and spoke with Jeani Hawking stating that at this time we were not going to place pt on neb meds as pt was doing well on the incruse so the [place] Rx could be cancelled.  Jeani Hawking verbalized understanding and stated tha we might want to call pt's son to let him know this and I stated to her that I would give him a call.  Called pt's son [name] but line just rang and rang and no machine ever kicked in for me to be able to leave a VM. Will try to call back later.

## 2018-08-22 ENCOUNTER — Ambulatory Visit: Payer: Self-pay | Admitting: Pharmacist

## 2018-08-22 ENCOUNTER — Other Ambulatory Visit: Payer: Self-pay | Admitting: Pharmacist

## 2018-08-22 NOTE — Telephone Encounter (Signed)
Attempted to call pt's son [name] but line rang and rang and then went to a busy signal. Unable to leave a VM. Will try to call back later.

## 2018-08-22 NOTE — Patient Outreach (Signed)
[facility]) Care Management  [place] 08/22/2018  Wesley Baker 04-Aug-1925 [id number]  Incoming call and voicemail from patient's son, [name].  Successful return call to son.  Son reports patient has spent ~$1200 TROOP so far this year.  He also has copies of income statements.  Patient will be eligible to apply for Trelegy therefore.  Reviewed [place] program with son who voiced understanding.    Plan: I will route patient assistance letter to [place] technician who will coordinate patient assistance program application process for medications listed above.  [facility] pharmacy technician will assist with obtaining all required documents from both patient and provider(s) and submit application(s) once completed.    [name], PharmD, [place] [phone]

## 2018-08-23 ENCOUNTER — Other Ambulatory Visit: Payer: Self-pay | Admitting: Pharmacy Technician

## 2018-08-23 NOTE — Patient Outreach (Signed)
Wesley Baker) Care Management  08/23/2018  Wesley Baker 01/05/1926 [id number]                          Medication Assistance Referral  Referral From: [place]  Medication/Company: Trelegy and Ventolin HFA / [place] Patient application portion:  Mailed Provider application portion: Faxed  to B. Warner Mccreedy, FNP   Follow up:  Will follow up with patient in 7-10 business days to confirm application(s) have been received.  Maud Deed Chana Bode Mellen Certified Pharmacy Technician [place] Management Direct [phone]

## 2018-08-23 NOTE — Telephone Encounter (Signed)
Called and spoke with pt's son Wesley Baker letting him know after pt's last OV 7/22 with Wesley Baker, Wesley Baker spoke with Wesley Baker and due to pt doing well on the incruse, they were not going to add Yupelri neb sol at this time.  Wesley Baker stated that he had received a call from [facility] yesterday 7/28 and they told him that pt was eligible to switch to trelegy and once pt did receive the trelegy, the symbicort would be dropped. Pt qualified through [place] for financial help. Wesley Baker stated that the approval from Wesley Baker in regards to this and [facility] should be sending paperwork to Wesley Baker so he can be able to send in documentation to help with the application process. Nothing further needed.

## 2018-08-25 ENCOUNTER — Other Ambulatory Visit: Payer: Self-pay

## 2018-08-25 ENCOUNTER — Telehealth: Payer: Self-pay

## 2018-08-25 DIAGNOSIS — J449 Chronic obstructive pulmonary disease, unspecified: Secondary | ICD-10-CM

## 2018-08-25 MED ORDER — TRELEGY ELLIPTA 100-62.5-25 MCG/INH IN AEPB: 1.0000 | INHALATION_SPRAY | Freq: Every day | RESPIRATORY_TRACT | 0 refills | Status: DC

## 2018-08-25 MED ORDER — ALBUTEROL SULFATE HFA 108 (90 BASE) MCG/ACT IN AERS: 2.0000 | INHALATION_SPRAY | RESPIRATORY_TRACT | 3 refills | Status: AC | PRN

## 2018-08-25 MED ORDER — ALBUTEROL SULFATE HFA 108 (90 BASE) MCG/ACT IN AERS: 2.0000 | INHALATION_SPRAY | Freq: Four times a day (QID) | RESPIRATORY_TRACT | 6 refills | Status: DC | PRN

## 2018-08-25 NOTE — Telephone Encounter (Signed)
Received request for printed prescriptions for Trelegy and Albuterol inhalers from Etter Sjogren, CPhT at [facility].  Prescriptions faxed to [phone].  Nothing further needed.

## 2018-08-30 DIAGNOSIS — L989 Disorder of the skin and subcutaneous tissue, unspecified: Secondary | ICD-10-CM | POA: Diagnosis not present

## 2018-08-30 DIAGNOSIS — I4891 Unspecified atrial fibrillation: Secondary | ICD-10-CM | POA: Diagnosis not present

## 2018-08-30 DIAGNOSIS — K5900 Constipation, unspecified: Secondary | ICD-10-CM | POA: Diagnosis not present

## 2018-08-30 DIAGNOSIS — J449 Chronic obstructive pulmonary disease, unspecified: Secondary | ICD-10-CM | POA: Diagnosis not present

## 2018-08-30 DIAGNOSIS — I509 Heart failure, unspecified: Secondary | ICD-10-CM | POA: Diagnosis not present

## 2018-08-30 DIAGNOSIS — Z7901 Long term (current) use of anticoagulants: Secondary | ICD-10-CM | POA: Diagnosis not present

## 2018-08-30 DIAGNOSIS — I4821 Permanent atrial fibrillation: Secondary | ICD-10-CM | POA: Diagnosis not present

## 2018-08-30 DIAGNOSIS — Z5181 Encounter for therapeutic drug level monitoring: Secondary | ICD-10-CM | POA: Diagnosis not present

## 2018-09-06 DIAGNOSIS — Z5181 Encounter for therapeutic drug level monitoring: Secondary | ICD-10-CM | POA: Diagnosis not present

## 2018-09-06 DIAGNOSIS — I4891 Unspecified atrial fibrillation: Secondary | ICD-10-CM | POA: Diagnosis not present

## 2018-09-06 DIAGNOSIS — Z7901 Long term (current) use of anticoagulants: Secondary | ICD-10-CM | POA: Diagnosis not present

## 2018-09-06 DIAGNOSIS — S81801A Unspecified open wound, right lower leg, initial encounter: Secondary | ICD-10-CM | POA: Diagnosis not present

## 2018-09-08 ENCOUNTER — Telehealth: Payer: Self-pay | Admitting: Pulmonary Disease

## 2018-09-08 ENCOUNTER — Other Ambulatory Visit: Payer: Self-pay | Admitting: Pharmacy Technician

## 2018-09-08 MED ORDER — AZITHROMYCIN 250 MG PO TABS: ORAL_TABLET | ORAL | 0 refills | Status: DC

## 2018-09-08 MED ORDER — PREDNISONE 10 MG PO TABS: ORAL_TABLET | ORAL | 0 refills | Status: DC

## 2018-09-08 NOTE — Telephone Encounter (Signed)
Okay.  Sorry to the patient is not feeling well.  Can place orders for:  Prednisone 10mg  tablet  >>>4 tabs for 2 days, then 3 tabs for 2 days, 2 tabs for 2 days, then 1 tab for 2 days, then stop >>>take with food  >>>take in the morning   Azithromycin 250mg  tablet  >>>Take 2 tablets (500mg  total) today, and then 1 tablet (250mg ) for the next four days  >>>take with food  >>>can also take probiotic and / or yogurt while on antibiotic   Please place the orders.  Please schedule patient to have follow-up with Dr. Loanne Drilling over the next 2 to 4 weeks for follow-up.   [name], FNP

## 2018-09-08 NOTE — Telephone Encounter (Signed)
Primary Pulmonologist: Dr. Loanne Drilling Last office visit and with whom: 08/16/2018 with Dr. Loanne Drilling What do we see them for (pulmonary problems): copd Last OV assessment/plan: Instructions    Return in about 3 months (around 11/16/2018). COPD Continue Symbicort 2 puffs twice a day Continue Incruse 1 puff once a day. Will REFILL. (If you run out of Incruse, start taking Duonebs three times a day.)  Follow-up in 3 months     Was appointment offered to patient (explain)?  Pt's son wants recommendations   Reason for call: Called and spoke with pt's son Wesley Baker who stated that pt has been SOB x4-5 days now and is also coughing up clear phlegm with a tinge of yellow-brown color in it as well. Wesley Baker said that pt will walk about 15 steps from the kitchen to the bathroom and will be really SOB. Wesley Baker also stated that pt is wheezing a lot which happens more when pt ambulates. Pt's face will become real red when he is unable to catch his breath after the few walking steps.  Wesley Baker said that pt has had to use his rescue inhaler at least 3-4 times daily due to being unable to breathe. Wesley Baker said that at times, pt's O2 will be around 88-89% at times. Pt is not on any O2 Pt does use the incruse daily as prescribed.  Pt does use the nebulizer prior to going to bed.  Wesley Baker said that pt denies any tightness in chest, no fever.  Wesley Baker said about 4 weeks on pt was on a pred taper as well as doxy abx which did help with pt's symptoms. Wesley Baker and pt want recommendations to help with pt's symptoms. Wesley Baker, please advise recs for pt. Thanks!

## 2018-09-08 NOTE — Telephone Encounter (Signed)
Called and spoke with pt's son [name] letting him know the info stated by Aaron Edelman and that we were going to send pred taper and zpak in for pt to see if this would help. Stated that we needed to get pt scheduled a f/u with Dr. Loanne Drilling. Appt has been scheduled for pt with Dr. Loanne Drilling 8/31 and both meds were called in to pt's preferred pharmacy. Nothing further needed.

## 2018-09-08 NOTE — Patient Outreach (Signed)
[facility]) Care Management  09/08/2018  Wesley Baker [birth year] [id number]   Received patient portion(s) of patient assistance application forTrelegy and Ventolin HFA. Faxed completed application and required documents into [employer].  Will follow up with company in 5-7 business days to check status of application.  Maud Deed Chana Bode [place] Certified Pharmacy Technician [facility] Direct Dial:[phone]

## 2018-09-12 ENCOUNTER — Other Ambulatory Visit: Payer: Self-pay | Admitting: Pharmacy Technician

## 2018-09-12 NOTE — Patient Outreach (Signed)
[facility]) Care Management  09/12/2018  Wesley Baker 07-16-1925 [id number]    Follow up call placed to Wesley Baker regarding patient assistance application(s) for Trelegy and Ventolin HFA , TIna confirms patient has been approved as of 8/18 until 01/25/19. Medication to arrive at patients home in 10-14 business days.  Follow up:  Will follow up with patient in 10-14 business days to confirm medication has been received.  Maud Deed Chana Bode [place] Certified Pharmacy Technician [facility] Direct Dial:[phone]

## 2018-09-20 DIAGNOSIS — Z7901 Long term (current) use of anticoagulants: Secondary | ICD-10-CM | POA: Diagnosis not present

## 2018-09-20 DIAGNOSIS — J441 Chronic obstructive pulmonary disease with (acute) exacerbation: Secondary | ICD-10-CM | POA: Diagnosis not present

## 2018-09-20 DIAGNOSIS — Z5181 Encounter for therapeutic drug level monitoring: Secondary | ICD-10-CM | POA: Diagnosis not present

## 2018-09-20 DIAGNOSIS — I4891 Unspecified atrial fibrillation: Secondary | ICD-10-CM | POA: Diagnosis not present

## 2018-09-21 DIAGNOSIS — C44319 Basal cell carcinoma of skin of other parts of face: Secondary | ICD-10-CM | POA: Diagnosis not present

## 2018-09-21 DIAGNOSIS — C44612 Basal cell carcinoma of skin of right upper limb, including shoulder: Secondary | ICD-10-CM | POA: Diagnosis not present

## 2018-09-25 ENCOUNTER — Encounter: Payer: Self-pay | Admitting: Pulmonary Disease

## 2018-09-25 ENCOUNTER — Other Ambulatory Visit: Payer: Self-pay

## 2018-09-25 ENCOUNTER — Ambulatory Visit (INDEPENDENT_AMBULATORY_CARE_PROVIDER_SITE_OTHER): Payer: Medicare HMO

## 2018-09-25 ENCOUNTER — Ambulatory Visit: Payer: Medicare HMO | Admitting: Pulmonary Disease

## 2018-09-25 VITALS — BP 124/88 | HR 67 | Temp 97.4°F | Ht 65.0 in | Wt 194.2 lb

## 2018-09-25 DIAGNOSIS — J410 Simple chronic bronchitis: Secondary | ICD-10-CM

## 2018-09-25 DIAGNOSIS — R600 Localized edema: Secondary | ICD-10-CM

## 2018-09-25 DIAGNOSIS — R0602 Shortness of breath: Secondary | ICD-10-CM | POA: Diagnosis not present

## 2018-09-25 DIAGNOSIS — Z7189 Other specified counseling: Secondary | ICD-10-CM | POA: Diagnosis not present

## 2018-09-25 DIAGNOSIS — J449 Chronic obstructive pulmonary disease, unspecified: Secondary | ICD-10-CM

## 2018-09-25 MED ORDER — PREDNISONE 10 MG PO TABS: 40.0000 mg | ORAL_TABLET | Freq: Every day | ORAL | 0 refills | Status: DC

## 2018-09-25 NOTE — Progress Notes (Signed)
Subjective:   PATIENT ID: Wesley Baker GENDER: male DOB: 1925-02-02, MRN: [id number]   HPI  Chief Complaint  Patient presents with   Follow-up    copd-can't go more than 10 steps before loses balance and weakness, increased confusion   Wesley Baker is a 85 year-old male former smoker with COPD, atrial fibrillation and HTN who presents for follow-up. Son is the caregiver and providers majority of the history.  He was last seen by me on 08/16/18. He was doing well on Symbicort and Incruse at the time. Since then, he developed worsening shortness of breath and productive requiring increased albuterol use, no desaturations < 88% per phone note. His son contacted via telephone our office on 8/14 and he was prescriped prednisone taper and azithromycin.  Today, he reports the medications have helped him immensely and not having any issues now. Denies shortness of breath however he does not perform any activity. His son expresses concern about confusion, falling and episodes of urinary and fecal incontinence when patient was unwell. He also reports that as he is tapering the steroids, his father is starting to wheeze again. Denies recent fevers, chills, chest pain. Has had lower extremity edema for which his PCP is providing lasix.  Social History: 96-pack-year history.  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  Past Medical History:  Diagnosis Date   COPD (chronic obstructive pulmonary disease) ([place])    Dysrhythmia    Gout    Hypertension    Pneumonia    recent and recurring over severalyears   Sleep apnea      Family History  Problem Relation Age of Onset   Diabetes Mellitus II Son    Diabetes Son      Social History   Occupational History    Occupation: retired     Comment: lives with his son, [name]   Tobacco Use   Smoking status: Former Smoker    Packs/day: 2.00    Years: 48.00    Pack years: 96.00    Types: Cigarettes    Quit date: 12/09/1991    Years since quitting: 26.8   Smokeless tobacco: Never Used  Substance and Sexual Activity   Alcohol use: No    Frequency: Never   Drug use: No   Sexual activity: Not on file    No Known Allergies   Outpatient Medications Prior to Visit  Medication Sig Dispense Refill   albuterol (VENTOLIN HFA) 108 (90 Base) MCG/ACT inhaler Inhale 2 puffs into the lungs every 4 (four) hours as needed for wheezing or shortness of breath. 18 g 3   allopurinol (ZYLOPRIM) 300 MG tablet Take 300 mg by mouth daily.     azithromycin (ZITHROMAX) 250 MG tablet Take two tabs today and then one daily until finished. 6 tablet 0   budesonide-formoterol (SYMBICORT) 160-4.5 MCG/ACT inhaler Inhale 2 puffs into the lungs 2 (two) times daily.     Fluticasone-Umeclidin-Vilant (TRELEGY ELLIPTA) 100-62.5-25 MCG/INH AEPB Inhale 1 puff into the lungs daily. 3 each 0   furosemide (LASIX) 40 MG tablet Take 40 mg by mouth daily. May take extra dose depending on weight     gabapentin (NEURONTIN) 600 MG tablet Take 600 mg by mouth 3 (three) times daily.     ipratropium (ATROVENT) 0.06 % nasal spray Place 1 spray into the nose 2 (  two) times daily.     ipratropium-albuterol (DUONEB) 0.5-2.5 (3) MG/3ML SOLN Inhale 3 mLs into the lungs 3 (three) times daily. Takes once in the evening     metoprolol succinate (TOPROL-XL) 100 MG 24 hr tablet Take 50 mg by mouth daily. Only takes 50 mg currently     predniSONE (DELTASONE) 10 MG tablet Take 4tabsx2days, 3tabsx2days, 2tabsx2days, 1tabx2days, then stop 20 tablet 0   umeclidinium bromide (INCRUSE ELLIPTA) 62.5 MCG/INH AEPB Inhale 1 puff into the lungs daily. 30 each 3   umeclidinium bromide (INCRUSE ELLIPTA) 62.5 MCG/INH AEPB Inhale 1 puff into the lungs daily. 4  each 0   warfarin (COUMADIN) 2.5 MG tablet Take 2.5 mg by mouth at bedtime. 2.5mg  T/Thursday/Sat and 1.25mg  all other days     No facility-administered medications prior to visit.    Review of Systems  Constitutional: Positive for malaise/fatigue. Negative for chills, diaphoresis and fever.  HENT: Negative for congestion.   Respiratory: Positive for cough, sputum production, shortness of breath and wheezing. Negative for hemoptysis.   Cardiovascular: Positive for leg swelling. Negative for chest pain, palpitations and orthopnea.  Gastrointestinal: Positive for diarrhea. Negative for heartburn.  Genitourinary: Positive for frequency and urgency.  Endo/Heme/Allergies: Positive for environmental allergies. Bruises/bleeds easily.     Objective:   Vitals:   09/25/18 1110 09/25/18 1111  BP:  124/88  Pulse: 67   Temp: (!) 97.4 F (36.3 C)   TempSrc: Temporal   SpO2: 95%   Weight: 194 lb 3.2 oz (88.1 kg)   Height: 5\' 5"  (1.651 m)    SpO2: 95 % O2 Device: None (Room air)  Physical Exam: General: Elderly, chronically ill-appearing, no acute distress HENT: [place], AT Eyes: EOMI, no scleral icterus Respiratory: Decreased right sided breath sounds, wheezing present bilaterally Cardiovascular: RRR, -M/R/G, no JVD GI: BS+, soft, nontender Extremities:1+ pitting edema in lower extremities,-tenderness Neuro: Awake, alert and oriented to self and location, follows commands, moves extremities x 4 Skin: Bruising on arms and anterior legs bilaterally Psych: Normal mood, normal affect  Data Reviewed:  Chest Imaging  CXR 09/25/18 - RLL infiltrate CT chest 02/03/2018-resolution of lower lobe airspace disease and effusions.  Unchanged 5 mm calcified lung nodule in the right upper lobe CT chest 11/25/2017-bilateral nodular and masslike consolidations predominantly in the lower lobes.  Emphysema CXR 02/20/18 - Emphysema, cardiomegaly. No acute infiltrate or edema.  PFT 02/01/18 FVC 2.22 (62%) FEV1  1.38 (56%) Ratio 64 DLCO 38% Interpretation: Moderately severe obstructive defect with severely reduced DLCO suggestive of emphysema  Labs CBC 03/12/17 reviewed. Hg 12.0   Imaging, labs and test noted above have been reviewed independently by me.    Assessment & Plan:   Discussion: 85 year old male with COPD, atrial fibrillation and HTN who presents for COPD exacerbation. We also spent time during this visit discussing overall goals of care. After discussion, Mr. Dargenio and his son agreed that CPR would not in align with what he would want but if he has any medical conditions that can be treated, he would want to pursue medical management including intubation if deemed necessary.   Moderately severe COPD COPD Exacerbation CONTINUE Symbicort 2 puffs twice a day CHANGE from Incruse to [place] times day  START Prednisone 40 mg x 5 days  Lower extremity swelling CONTINUE lasix per Dr. Nyra Capes Will order echocardiogram to evaluate your heart and valves  We will order a chest x-ray for today. Depending on the results, will order antibiotic: Levaquin ordered  We  discussed goals of care and quality of life today. Per our discussion, I will update our records to reflect that you would NOT want chest compressions or CPR. However you would be willing to be on a ventilator if needed for your care. Please continue to discuss this decision with your family.  Orders Placed This Encounter  Procedures   DG Chest 2 View    Standing Status:   Future    Number of Occurrences:   1    Standing Expiration Date:   11/25/2019    Order Specific Question:   Reason for Exam (SYMPTOM  OR DIAGNOSIS REQUIRED)    Answer:   shortness of breath    Order Specific Question:   Preferred imaging location?    Answer:   Internal    Order Specific Question:   Radiology Contrast Protocol - do NOT remove file path    Answer:   [email or web address]   DNR (Do Not Resuscitate)     Order Specific Question:   In the event of cardiac or respiratory ARREST    Answer:   Do not call a code blue    Order Specific Question:   In the event of cardiac or respiratory ARREST    Answer:   Do not perform Intubation, CPR, defibrillation or ACLS    Order Specific Question:   In the event of cardiac or respiratory ARREST    Answer:   Use medication by any route, position, wound care, and other measures to relive pain and suffering. May use oxygen, suction and manual treatment of airway obstruction as needed for comfort.    Order Specific Question:   Comments    Answer:   DNR Limited. OK with intubation for respiratory distress.   ECHOCARDIOGRAM COMPLETE BUBBLE STUDY    Standing Status:   Future    Standing Expiration Date:   12/25/2019    Scheduling Instructions:     First available    Order Specific Question:   Where should this test be performed    Answer:   [facility]    Order Specific Question:   Perflutren DEFINITY (image enhancing agent) should be administered unless hypersensitivity or allergy exist    Answer:   Administer Perflutren    Order Specific Question:   Reason for exam    Answer:   Other-Full Diagnosis List    Order Specific Question:   Full ICD-10/Reason for Exam    Answer:   Leg swelling [id number]   Pulmonary function test    Standing Status:   Future    Standing Expiration Date:   09/25/2019    Order Specific Question:   Where should this test be performed?    Answer:   [facility]    Order Specific Question:   ABG    Answer:   Yes   Meds ordered this encounter  Medications   predniSONE (DELTASONE) 10 MG tablet    Sig: Take 4 tablets (40 mg total) by mouth daily with breakfast.    Dispense:  20 tablet    Refill:  0    Return in about 1 month (around 10/25/2018).  Reginaldo Hazard Rodman Pickle, MD [facility] 09/25/2018 2:58 PM  Personal pager: [phone] If unanswered, please page CCM On-call: [phone]

## 2018-09-25 NOTE — Patient Instructions (Addendum)
COPD Exacerbation CONTINUE Symbicort 2 puffs twice a day CHANGE from Incruse to [place] times day  START Prednisone 40 mg x 5 days  Lower extremity swelling CONTINUE lasix per Dr. Nyra Capes Will order echocardiogram to evaluate your heart and valves  We will order a chest x-ray for today. Depending on the results, will order antibiotic  We discussed goals of care and quality of life today. Per our discussion, I will update our records to reflect that you would NOT want chest compressions or CPR. However you would be willing to be on a ventilator if needed for your care. Please continue to discuss this decision with your family.

## 2018-09-26 ENCOUNTER — Telehealth: Payer: Self-pay | Admitting: Pulmonary Disease

## 2018-09-26 ENCOUNTER — Other Ambulatory Visit: Payer: Self-pay | Admitting: *Deleted

## 2018-09-26 DIAGNOSIS — J449 Chronic obstructive pulmonary disease, unspecified: Secondary | ICD-10-CM

## 2018-09-26 MED ORDER — LEVOFLOXACIN 750 MG PO TABS: 750.0000 mg | ORAL_TABLET | Freq: Every day | ORAL | 0 refills | Status: DC

## 2018-09-26 MED ORDER — UMECLIDINIUM BROMIDE 62.5 MCG/INH IN AEPB: 1.0000 | INHALATION_SPRAY | Freq: Every day | RESPIRATORY_TRACT | 3 refills | Status: AC

## 2018-09-26 NOTE — Telephone Encounter (Signed)
ATC patient. Someone answered the phone but did not respond. Will call patient back later today. RX for Incruse has been printed.

## 2018-09-26 NOTE — Telephone Encounter (Signed)
-----   Message from Adaline Sill, CPhT sent at 09/26/2018  8:33 AM EDT ----- Regarding: RE: Inhaler therapy Hello!  This is an easy one! If you will please fax me over a 3 month script for the Incruse @ [phone].  The Trelegy has already be sent to patient, how would you like [place] to advise patient or would you all prefer too?   Thanks you all!! ----- Message ----- From: Lauraine Rinne, NP Sent: 09/25/2018   8:44 PM EDT To: Adaline Sill, CPhT, Rudean Haskell, [facility], # Subject: RE: Inhaler therapy                            Thanks Dr. Loanne Drilling.   Colleen and Caryl Pina,   What do we need to do to get the pt coverage for incruse ellipta? Will that transfer as he has been approved for [place] coverage?  B ----- Message ----- From: Margaretha Seeds, MD Sent: 09/25/2018   5:23 PM EDT To: Adaline Sill, CPhT, Rudean Haskell, RPH, # Subject: RE: Inhaler therapy                            Appreciate all the work. Patient prefers Incruse for the time-being that way he can switch to Duonebs as needed for exacerbations.  JE ----- Message ----- From: Lauraine Rinne, NP Sent: 09/25/2018   2:25 PM EDT To: Adaline Sill, CPhT, Rudean Haskell, [facility], # Subject: RE: Inhaler therapy                            Colleen,   Thank you for following up.  That is great news that he has been approved.  It looks like Dr. Loanne Drilling saw this patient last.  I will let her make the call on what maintenance medications the patient will be on.  It appears she would prefer for the patient be on Incruse Ellipta.Dr. Flossie Dibble like the patient was approved for Trelegy Ellipta, this is good news because this means if you would like for him to just be simply maintained on Incruse Ellipta then we can leave the patient on that.  And we can work to get that through the [place] for you program.Which would you prefer for the patient to be on?  Can you respond to this message so that way [facility] can work to make sure the  patient is on the correct medication.[name], FNP ----- Message ----- From: Rudean Haskell, [facility] Sent: 09/25/2018   1:51 PM EDT To: Adaline Sill, CPhT, Lauraine Rinne, NP Subject: Inhaler therapy                                [name], Patient has been approved for Trelegy and Ventolin via [place] and medications should be arriving to patient's home.  Noticed therapy has been adjusted recently though and wanted to clarify with you.  Is Trelegy triple therapy still what you prefer him to be on or should we look into other patient assistance programs for different medication?   Thanks, [employer]

## 2018-09-26 NOTE — Telephone Encounter (Signed)
09/26/2018 0840  Cherina,   Can we please contact this patient and let him know that we are correcting his prescription with [facility].  I understand from Dr. Loanne Drilling and the patient would prefer to remain on Incruse Ellipta.  We will send [facility] a 24-month prescription for Incruse Ellipta that needs to be faxed to them.  See the information listed below from [facility].  Please instruct the patient to place the Trelegy Ellipta that he is about to receive from [place] on a shelf and to continue to only use the Incruse Ellipta as discussed with Dr. Loanne Drilling at last [place].  I would recommend holding onto Trelegy Ellipta just in case we run into high financial cost of medications in January/2021 would be nice to have this as a backup.    You cannot take Trelegy Ellipta and Incruse Ellipta at the same time.  Can you please place a 2-month prescription for Incruse Ellipta.  Print this prescription I can sign it.  And we can fax this over to [facility].  This way the patient will receive Incruse Ellipta in the mail as he is received the Trelegy Ellipta from [facility].  We will route to Dr. Loanne Drilling and Ander Purpura as Juluis Rainier.  [name] FNP

## 2018-09-27 ENCOUNTER — Ambulatory Visit (HOSPITAL_COMMUNITY)
Admission: RE | Admit: 2018-09-27 | Discharge: 2018-09-27 | Disposition: A | Discharge status: Home / Self Care | Payer: Medicare HMO | Source: Ambulatory Visit | Attending: Pulmonary Disease | Admitting: Pulmonary Disease

## 2018-09-27 ENCOUNTER — Ambulatory Visit (HOSPITAL_BASED_OUTPATIENT_CLINIC_OR_DEPARTMENT_OTHER): Payer: Medicare HMO

## 2018-09-27 ENCOUNTER — Other Ambulatory Visit: Payer: Self-pay

## 2018-09-27 DIAGNOSIS — R0602 Shortness of breath: Secondary | ICD-10-CM | POA: Diagnosis not present

## 2018-09-27 DIAGNOSIS — J410 Simple chronic bronchitis: Secondary | ICD-10-CM

## 2018-09-27 DIAGNOSIS — R600 Localized edema: Secondary | ICD-10-CM | POA: Diagnosis not present

## 2018-09-27 LAB — BLOOD GAS, ARTERIAL
Acid-Base Excess: 3.6 mmol/L — ABNORMAL HIGH (ref 0.0–2.0)
Bicarbonate: 27.3 mmol/L (ref 20.0–28.0)
Drawn by: 129711
FIO2: 21
O2 Saturation: 95.6 %
Patient temperature: 98.6
pCO2 arterial: 39.1 mmHg (ref 32.0–48.0)
pH, Arterial: 7.458 — ABNORMAL HIGH (ref 7.350–7.450)
pO2, Arterial: 75.4 mmHg — ABNORMAL LOW (ref 83.0–108.0)

## 2018-09-28 ENCOUNTER — Other Ambulatory Visit: Payer: Self-pay | Admitting: *Deleted

## 2018-09-28 DIAGNOSIS — J449 Chronic obstructive pulmonary disease, unspecified: Secondary | ICD-10-CM

## 2018-09-29 ENCOUNTER — Other Ambulatory Visit: Payer: Self-pay | Admitting: *Deleted

## 2018-09-29 ENCOUNTER — Telehealth: Payer: Self-pay | Admitting: *Deleted

## 2018-09-29 DIAGNOSIS — R600 Localized edema: Secondary | ICD-10-CM

## 2018-09-29 DIAGNOSIS — R931 Abnormal findings on diagnostic imaging of heart and coronary circulation: Secondary | ICD-10-CM

## 2018-09-29 NOTE — Telephone Encounter (Signed)
Incruse has been faxed to [facility].  Still unable to get in touch with patient or family. Aaron Edelman

## 2018-09-29 NOTE — Telephone Encounter (Signed)
PER DR O'NEAL PATIENT NEEDS A NEW PT APPT.     SPOKE TO PATIENT'S SON .  NEW PATIENT APPOINTMENT SCHEDULE FOR 10/04/18  AT 2:20. SON IS AWARE TO BE HERE AT 2 PM . WEAR FACE COVERING  ADDRESS GIVEN . VERBALIZED UNDERSTANDING.

## 2018-10-04 ENCOUNTER — Ambulatory Visit (INDEPENDENT_AMBULATORY_CARE_PROVIDER_SITE_OTHER): Payer: Medicare HMO | Admitting: Cardiovascular Disease

## 2018-10-04 ENCOUNTER — Encounter: Payer: Self-pay | Admitting: Cardiovascular Disease

## 2018-10-04 ENCOUNTER — Other Ambulatory Visit: Payer: Self-pay

## 2018-10-04 VITALS — BP 139/67 | HR 70 | Temp 97.2°F | Ht 65.0 in | Wt 191.0 lb

## 2018-10-04 DIAGNOSIS — I4821 Permanent atrial fibrillation: Secondary | ICD-10-CM

## 2018-10-04 DIAGNOSIS — I340 Nonrheumatic mitral (valve) insufficiency: Secondary | ICD-10-CM | POA: Diagnosis not present

## 2018-10-04 DIAGNOSIS — K5909 Other constipation: Secondary | ICD-10-CM | POA: Diagnosis not present

## 2018-10-04 DIAGNOSIS — Z7901 Long term (current) use of anticoagulants: Secondary | ICD-10-CM | POA: Diagnosis not present

## 2018-10-04 DIAGNOSIS — I361 Nonrheumatic tricuspid (valve) insufficiency: Secondary | ICD-10-CM | POA: Diagnosis not present

## 2018-10-04 DIAGNOSIS — I4891 Unspecified atrial fibrillation: Secondary | ICD-10-CM | POA: Diagnosis not present

## 2018-10-04 DIAGNOSIS — Z5181 Encounter for therapeutic drug level monitoring: Secondary | ICD-10-CM | POA: Diagnosis not present

## 2018-10-04 MED ORDER — APIXABAN 5 MG PO TABS: 5.0000 mg | ORAL_TABLET | Freq: Two times a day (BID) | ORAL | 6 refills | Status: DC

## 2018-10-04 NOTE — Progress Notes (Signed)
Cardiology Office Note:   Date:  10/04/2018  NAME:  Wesley Baker    MRN: [id number] DOB:  1925-10-23   PCP:  Maryella Shivers, MD  Cardiologist:  No primary care provider on file.  Electrophysiologist:  None   Referring MD: Maryella Shivers, MD   Chief Complaint  Patient presents with   Atrial Fibrillation   History of Present Illness:   Wesley Baker is a 85 y.o. male with a hx of COPD, atrial fibrillation, hypertension who is being seen today for the evaluation of cardiomyopathy at the request of Maryella Shivers, MD.  He presents at the request of his pulmonologist for evaluation of mitral vegetation and tricuspid regurgitation.  He reports for the past several months has had worsening lower extremity edema.  He lives with his son, and is able to get around the house, but does not do any strenuous exercise.  His son reports he is able to shower with assistance, but is able to dress himself.  He does not cook or clean.  His lower extremity edema has worsened and he is limited mobility by this.  He reports a diagnosis of atrial fibrillation some 20 years ago, and has been maintained on warfarin since that time.  It appears there never was an attempt to get him out of atrial fibrillation.  He does not follow with a cardiologist nor has he seen one in 45 years.  He is just been rate control with metoprolol and anticoagulated with warfarin.  His most recent echocardiogram showed severe tricuspid regurgitation and at least moderate mitral regurgitation.  He likely has more mitral regurgitation, but the study was difficult.  He reports no chest pain or pressure with exercise.  He is severely limited with shortness of breath with exertion.  He appears to have had shortness of breath and limitations with mobility ever since 2018.  His son reports a hip replacement in 2018 and this is resulted in severe debility, with limited mobility.  Since that time he has been very limited in his activity  level.  He reports that his lower extremity edema has been managed by his primary care physician with intermittent Lasix.  He does mention that his INR has been difficult to control as of recently.  He had a goals of care discussion with his pulmonologist, and he is DNR.  I discussed with his son that his regurgitation of the tricuspid valve is severe, and likely merits intervention, however there not sure they would even want to be evaluated for such intervention.  Past Medical History: Past Medical History:  Diagnosis Date   COPD (chronic obstructive pulmonary disease) ([place])    Dysrhythmia    Gout    Hypertension    Pneumonia    recent and recurring over severalyears   Sleep apnea     Past Surgical History: Past Surgical History:  Procedure Laterality Date   BACK SURGERY     HIP ARTHROPLASTY Right 03/10/2017   Procedure: ARTHROPLASTY BIPOLAR HIP (HEMIARTHROPLASTY);  Surgeon: Renette Butters, MD;  Location: [facility];  Service: Orthopedics;  Laterality: Right;   KNEE SURGERY     PROSTATE SURGERY      Current Medications: Current Meds  Medication Sig   albuterol (VENTOLIN HFA) 108 (90 Base) MCG/ACT inhaler Inhale 2 puffs into the lungs every 4 (four) hours as needed for wheezing or shortness of breath.   allopurinol (ZYLOPRIM) 300 MG tablet Take 300 mg by mouth daily.   budesonide-formoterol (SYMBICORT) 160-4.5 MCG/ACT  inhaler Inhale 2 puffs into the lungs 2 (two) times daily.   furosemide (LASIX) 40 MG tablet Take 40 mg by mouth daily. May take extra dose depending on weight   gabapentin (NEURONTIN) 600 MG tablet Take 600 mg by mouth 3 (three) times daily.   ipratropium (ATROVENT) 0.06 % nasal spray Place 1 spray into the nose 2 (two) times daily.   ipratropium-albuterol (DUONEB) 0.5-2.5 (3) MG/3ML SOLN Inhale 3 mLs into the lungs 3 (three) times daily. Takes once in the evening   metoprolol succinate (TOPROL-XL) 100 MG 24 hr tablet Take 50 mg by mouth daily. Only  takes 50 mg currently   [DISCONTINUED] warfarin (COUMADIN) 2.5 MG tablet Take 2.5 mg by mouth at bedtime. 2.5mg  T/Thursday/Sat and 1.25mg  all other days     Allergies:    Patient has no known allergies.   Social History: Social History   Socioeconomic History   Marital status: Single    Spouse name: Not on file   Number of children: Not on file   Years of education: Not on file   Highest education level: Not on file  Occupational History   Occupation: retired     Comment: lives with his son, [name]   Social Needs   Emergency planning/management officer strain: Not hard at all   Food insecurity    Worry: Never true    Inability: Never true   Transportation needs    Medical: No    Non-medical: No  Tobacco Use   Smoking status: Former Smoker    Packs/day: 2.00    Years: 48.00    Pack years: 96.00    Types: Cigarettes    Quit date: 12/09/1991    Years since quitting: 26.8   Smokeless tobacco: Never Used  Substance and Sexual Activity   Alcohol use: No    Frequency: Never   Drug use: No   Sexual activity: Not on file  Lifestyle   Physical activity    Days per week: Not on file    Minutes per session: Not on file   Stress: Not on file  Relationships   Social connections    Talks on phone: Not on file    Gets together: Not on file    Attends religious service: Not on file    Active member of club or organization: Not on file    Attends meetings of clubs or organizations: Not on file    Relationship status: Not on file  Other Topics Concern   Not on file  Social History Narrative   Lives at home with son that provides 63 support      Family History: The patient's family history includes Diabetes in his son; Diabetes Mellitus II in his son; Heart disease in his brother.  ROS:   All other ROS reviewed and negative. Pertinent positives noted in the HPI.     EKGs/Labs/Other Studies Reviewed:   The following studies were personally reviewed by me today:  Echo  09/27/2018  1. The left ventricle has normal systolic function, with an ejection fraction of 55-60%. The cavity size was normal. There is mildly increased left ventricular wall thickness. Left ventricular diastolic function could not be evaluated secondary to  atrial fibrillation. There is right ventricular volume overload.  2. The mitral valve is degenerative. Moderate thickening of the mitral valve leaflet. Mild calcification of the mitral valve leaflet. There is moderate mitral annular calcification present. Mitral valve regurgitation is mild to moderate by color flow  Doppler. The MR  jet is posteriorly-directed.  3. There is mild to moderate MR which is eccentric and directed posteriorly. The PMVL appears calcified and restricted. The degree of MR likely is underestimated due to poor interrogation. Due to the size of the LA and atrial fibrillation, atrial  functional MR could be the etiology of the impressive LA. This also could results in similar pathology for the TV apparatus (severe TR and huge RA!).  4. Left atrial size was severely dilated.  5. Right atrial size was severely dilated.  6. Trivial pericardial effusion is present.  7. The tricuspid valve is grossly normal. Tricuspid valve regurgitation is severe.  8. There is severe TR with systolic flow reversal in the hepatic veins.  9. The aortic valve is tricuspid. Mild thickening of the aortic valve. Mild calcification of the aortic valve. No stenosis of the aortic valve. 10. The aorta is normal unless otherwise noted. 11. The aortic root and ascending aorta are normal in size and structure. 12. There is evidence of plaque in the aortic root. 13. The right ventricle has mildly reduced systolic function. The cavity was moderately enlarged. There is no increase in right ventricular wall thickness. Right ventricular systolic pressure is moderately elevated with an estimated pressure of 40.6  mmHg. 14. RVSP is severely underestimated due to  severe TR. 15. The inferior vena cava was dilated in size with <50% respiratory variability. 16. When compared to the prior study: No prior study.  EKG:  EKG is ordered today.  The ekg ordered today demonstrates atrial fibrillation, heart rate 72, right bundle branch block, anteroseptal infarct noted, low voltage, normal intervals, and was personally reviewed by me.   Recent Labs: No results found for requested labs within last 8760 hours.   Recent Lipid Panel No results found for: CHOL, TRIG, HDL, CHOLHDL, VLDL, LDLCALC, LDLDIRECT  Physical Exam:   VS:  BP 139/67    Pulse 70    Temp (!) 97.2 F (36.2 C)    Ht 5\' 5"  (1.651 m)    Wt 191 lb (86.6 kg)    SpO2 99%    BMI 31.78 kg/m    Wt Readings from Last 3 Encounters:  10/04/18 191 lb (86.6 kg)  09/25/18 194 lb 3.2 oz (88.1 kg)  08/16/18 193 lb 9.6 oz (87.8 kg)    General: Well nourished, well developed, in no acute distress Heart: Atraumatic, normal size  Eyes: PEERLA, EOMI  Neck: Supple, JVD noted 8 to 10 cm of water Endocrine: No thryomegaly Cardiac: Irregular rhythm, 3 out of 6 holosystolic murmur Lungs: Clear to auscultation bilaterally, no wheezing, rhonchi or rales  Abd: Soft, nontender, no hepatomegaly  Ext: 2+ pitting edema noted to just below the knees, he has changes of chronic venous insufficiency, there is a right lower extremity wound that is without evidence of infection Musculoskeletal: No deformities, BUE and BLE strength normal and equal Skin: Wounds noted on the right lower extremity Neuro: Alert and oriented to person, place, time, and situation, CNII-XII grossly intact, no focal deficits  Psych: Normal mood and affect   ASSESSMENT:   NAME@ is a 85 y.o. male who presents for the following: 1. Permanent atrial fibrillation   2. Nonrheumatic tricuspid valve regurgitation   3. Nonrheumatic mitral valve regurgitation     PLAN:   1. Permanent atrial fibrillation 2. Nonrheumatic tricuspid valve  regurgitation 3. Nonrheumatic mitral valve regurgitation -He presents with a longstanding history of permanent atrial fibrillation.  I believe this A. fib has led to  significant mitral regurgitation and tricuspid regurgitation.  This is commonly seen with atrial functional mitral regurgitation that is being recognized as a more common problem.  His tricuspid regurgitation is severe, with right heart dysfunction and symptoms of overt right heart failure.  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  Currently there are no mainstream therapies for this, and only therapies to reduce symptoms.  Currently, they report they are able to manage the lower extremity edema at this time.  They do understand that he is 85 years of age, and unlikely to gain significant improvement in his functionality.  However, they do wish to proceed with supportive care for now.  He likely also has more significant mitral regurgitation that was seen on the echocardiogram.  However due to their desire for a more conservative approach, I think we will forego a transesophageal echocardiogram to further evaluate this, and just focus on symptom management. 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  This has a better bleeding profile than warfarin, and I suspect his recent increases in INR related to liver dysfunction from significant tricuspid regurgitation. -We will also check a CMP and INR today - I have instructed him to take his Eliquis on Friday -We will see him back in 3  months for further supportive care including management of his lower extremity edema   Disposition: Return in about 3 months (around 01/03/2019).  Medication Adjustments/Labs and Tests Ordered: Current medicines are reviewed at length with the patient today.  Concerns regarding medicines are outlined above.  Orders Placed This Encounter  Procedures   Comprehensive Metabolic Panel (CMET)   INR/PT   EKG 12-Lead   Meds ordered this encounter  Medications   apixaban (ELIQUIS) 5 MG TABS tablet    Sig: Take 1 tablet (5 mg total) by mouth 2 (two) times daily.    Dispense:  60 tablet    Refill:  6    Patient Instructions  Medication Instructions:  Stop Coumadin Start Eliquis 5 mg twice a day Increase Lasix 40 mg twice a day for 3 days only then return to normal dose 40 mg daily   Lab work: Cmet,Inr today   Testing/Procedures: None ordered  Follow-Up: At [facility], you and your health needs are our priority.  As part of our continuing mission to provide you with exceptional heart care, we have created designated Provider Care Teams.  These Care Teams include your primary Cardiologist (physician) and Advanced Practice Providers (APPs -  Physician Assistants and Nurse Practitioners) who all work together to provide you with the care you need, when you need it.  Schedule follow up appointment in 3 months  Tuesday 01/02/19 at 2:20 pm      Signed, [place] T. Audie Box, [place]  [address] [place], [place] [address] [phone]  10/04/2018 3:22 PM

## 2018-10-04 NOTE — Patient Instructions (Addendum)
Medication Instructions:  Stop Coumadin Start Eliquis 5 mg twice a day Increase Lasix 40 mg twice a day for 3 days only then return to normal dose 40 mg daily   Lab work: Cmet,Inr today   Testing/Procedures: None ordered  Follow-Up: At [facility], you and your health needs are our priority.  As part of our continuing mission to provide you with exceptional heart care, we have created designated Provider Care Teams.  These Care Teams include your primary Cardiologist (physician) and Advanced Practice Providers (APPs -  Physician Assistants and Nurse Practitioners) who all work together to provide you with the care you need, when you need it. . Schedule follow up appointment in 3 months  Tuesday 01/02/19 at 2:20 pm

## 2018-10-05 ENCOUNTER — Other Ambulatory Visit: Payer: Self-pay | Admitting: Pharmacy Technician

## 2018-10-05 DIAGNOSIS — R0902 Hypoxemia: Secondary | ICD-10-CM | POA: Diagnosis not present

## 2018-10-05 DIAGNOSIS — J449 Chronic obstructive pulmonary disease, unspecified: Secondary | ICD-10-CM | POA: Diagnosis not present

## 2018-10-05 LAB — COMPREHENSIVE METABOLIC PANEL
ALT: 16 IU/L (ref 0–44)
AST: 17 IU/L (ref 0–40)
Albumin/Globulin Ratio: 1.7 (ref 1.2–2.2)
Albumin: 3.6 g/dL (ref 3.5–4.6)
Alkaline Phosphatase: 90 IU/L (ref 39–117)
BUN/Creatinine Ratio: 18 (ref 10–24)
BUN: 17 mg/dL (ref 10–36)
Bilirubin Total: 2.1 mg/dL — ABNORMAL HIGH (ref 0.0–1.2)
CO2: 29 mmol/L (ref 20–29)
Calcium: 8.7 mg/dL (ref 8.6–10.2)
Chloride: 93 mmol/L — ABNORMAL LOW (ref 96–106)
Creatinine, Ser: 0.92 mg/dL (ref 0.76–1.27)
GFR calc Af Amer: 84 mL/min/{1.73_m2} (ref >59)
GFR calc non Af Amer: 72 mL/min/{1.73_m2} (ref >59)
Globulin, Total: 2.1 g/dL (ref 1.5–4.5)
Glucose: 78 mg/dL (ref 65–99)
Potassium: 3.8 mmol/L (ref 3.5–5.2)
Sodium: 137 mmol/L (ref 134–144)
Total Protein: 5.7 g/dL — ABNORMAL LOW (ref 6.0–8.5)

## 2018-10-05 LAB — PROTIME-INR
INR: 2.6 — ABNORMAL HIGH (ref 0.8–1.2)
Prothrombin Time: 26.9 s — ABNORMAL HIGH (ref 9.1–12.0)

## 2018-10-05 NOTE — Patient Outreach (Signed)
[facility]) Care Management  10/05/2018  [name] 1925/12/10 [id number]    Follow up call placed to Wesley Baker regarding patient assistance application(s) for Incruse Ellipta , [name] confirms patient has been approved as of 9/10 until 01/25/19. Medication to arrive at patients home in 7-10 business days.  Follow up:  Will follow up with patient in 10-14 business days to confirm medication has been delivered.  Maud Deed Chana Bode [place] Certified Pharmacy Technician [place] Management Direct [phone]

## 2018-10-06 ENCOUNTER — Ambulatory Visit: Payer: Self-pay | Admitting: Pharmacist

## 2018-10-06 ENCOUNTER — Other Ambulatory Visit: Payer: Self-pay | Admitting: Pharmacist

## 2018-10-06 NOTE — Patient Outreach (Signed)
[facility] [facility]) Care Management  [place] 10/06/2018  Wesley Baker 10-Jun-1925 [id number]  Successful call with patient's son, [name].  Patient approved for Trelegy via [employer] and medication has arrived at home however change in inhaler recommendations from Dr. Loanne Drilling.   -Patient now to be maintained on Incruse and Duonebs PRN.    Incruse also via [place], patient assistance program application already approved, and medication should arrive in 1-2 weeks.  Counseled son to keep Trelegy in case patient needs it in the future but do not use right now.  Patient is currently using Symbicort and will switch to Incruse when medication arrives.  Son is aware to only use Incruse and not the others as this would be duplicate therapy.  Reviewed [place] patient assistance criteria for 2021 as well ($600 out of pocket expenditure).  Son voiced understanding.    [facility] pharmacy technician will f/u with son and patient in 1-2 weeks to ensure Incruse has arrived.   [name], PharmD, [place] [phone]    s

## 2018-10-10 NOTE — Telephone Encounter (Signed)
Prescription has been printed and sent to [facility].  Nothing further is needed.  We will close encounter.

## 2018-10-11 DIAGNOSIS — Z6831 Body mass index (BMI) 31.0-31.9, adult: Secondary | ICD-10-CM | POA: Diagnosis not present

## 2018-10-11 DIAGNOSIS — I482 Chronic atrial fibrillation, unspecified: Secondary | ICD-10-CM | POA: Diagnosis not present

## 2018-10-11 DIAGNOSIS — T17908A Unspecified foreign body in respiratory tract, part unspecified causing other injury, initial encounter: Secondary | ICD-10-CM | POA: Diagnosis not present

## 2018-10-23 ENCOUNTER — Other Ambulatory Visit: Payer: Self-pay | Admitting: Pharmacy Technician

## 2018-10-23 NOTE — Patient Outreach (Signed)
[facility]) Care Management  10/23/2018  Wesley Baker 09-16-1925 [id number]    Unsuccessful call placed to patients son, [name] regarding patient assistance medication delivery of Incruse Ellipta, HIPAA identifiers verified.   Follow up:  Will make additional call attempt in 2-3 business days if call has not been returned.  Maud Deed Chana Bode [place] Certified Pharmacy Technician [place] Management Direct [phone]

## 2018-10-26 ENCOUNTER — Encounter: Payer: Self-pay | Admitting: General Practice

## 2018-10-26 ENCOUNTER — Other Ambulatory Visit: Payer: Self-pay | Admitting: Pharmacy Technician

## 2018-10-26 ENCOUNTER — Telehealth: Payer: Self-pay | Admitting: Cardiovascular Disease

## 2018-10-26 ENCOUNTER — Other Ambulatory Visit: Payer: Self-pay

## 2018-10-26 ENCOUNTER — Other Ambulatory Visit: Payer: Self-pay | Admitting: Pharmacist

## 2018-10-26 ENCOUNTER — Ambulatory Visit: Payer: Medicare HMO | Admitting: General Practice

## 2018-10-26 VITALS — BP 128/70 | HR 60 | Temp 97.9°F | Ht 65.0 in | Wt 201.4 lb

## 2018-10-26 DIAGNOSIS — I4821 Permanent atrial fibrillation: Secondary | ICD-10-CM

## 2018-10-26 DIAGNOSIS — R600 Localized edema: Secondary | ICD-10-CM

## 2018-10-26 MED ORDER — POTASSIUM CHLORIDE CRYS ER 20 MEQ PO TBCR: 20.0000 meq | EXTENDED_RELEASE_TABLET | Freq: Every day | ORAL | 3 refills | Status: DC

## 2018-10-26 MED ORDER — TORSEMIDE 20 MG PO TABS: 40.0000 mg | ORAL_TABLET | Freq: Every day | ORAL | 3 refills | Status: DC

## 2018-10-26 NOTE — Patient Outreach (Signed)
[place]) Care Management  [place] 10/26/2018  BLAIKE PADUANO 1925/07/08 [id number]  Reason for call: f/u on Incruse  Message received from [place] technician that patient's son has a question about Incruse administration.  Medication has arrived from [place] patient assistance program.   Outreach:  Unsuccessful telephone call attempt #1 to patient's son.  HIPAA compliant voicemail left requesting a return call  Plan:  -I will make another outreach attempt to patient within 3-4 business days.    [name], PharmD, [place] [phone]

## 2018-10-26 NOTE — Telephone Encounter (Signed)
° ° °  Pt c/o swelling: STAT is pt has developed SOB within 24 hours  1) How much weight have you gained and in what time span?   2) If swelling, where is the swelling located? Legs, feet , ankles  3) Are you currently taking a fluid pill? yes  4) Are you currently SOB? Yes, COPD  5) Do you have a log of your daily weights (if so, list)? 198.5  Yesterday , 195 on Monday  6) Have you gained 3 pounds in a day or 5 pounds in a week?   7) Have you traveled recently? no

## 2018-10-26 NOTE — Patient Instructions (Addendum)
Medication Instructions:   START TORSEMIDE 40 MG DAILY  START POTASSIUM 20 MEQ DAILY If you need a refill on your cardiac medications before your next appointment, please call your pharmacy.   Lab work: You will need to to have labs (blood work) drawn IN 5 DAYS:  BMET If you have labs (blood work) drawn today and your tests are completely normal, you will receive your results only by: Marland Kitchen MyChart Message (if you have MyChart) OR . A paper copy in the mail If you have any lab test that is abnormal or we need to change your treatment, we will call you to review the results.  Testing/Procedures: NONE ordered at this time of appointment   Follow-Up: At [facility], you and your health needs are our priority.  As part of our continuing mission to provide you with exceptional heart care, we have created designated Provider Care Teams.  These Care Teams include your primary Cardiologist (physician) and Advanced Practice Providers (APPs -  Physician Assistants and Nurse Practitioners) who all work together to provide you with the care you need, when you need it. . Your physician recommends that you schedule a follow-up appointment in: 1 week with Coletta Memos, NP or Almyra Deforest, PA-C   Any Other Special Instructions Will Be Listed Below (If Applicable).

## 2018-10-26 NOTE — Progress Notes (Signed)
Cardiology Clinic Note   Patient Name: Wesley Baker Date of Encounter: 10/26/2018  Primary Care Provider:  Maryella Shivers, Baker Primary Cardiologist:  Wesley Field, Baker  Patient Profile    Wesley Baker 85 year old male presents today for follow-up of his atrial fibrillation and lower extremity edema.  Past Medical History    Past Medical History:  Diagnosis Date   COPD (chronic obstructive pulmonary disease) ([place])    Dysrhythmia    Gout    Hypertension    Pneumonia    recent and recurring over severalyears   Sleep apnea    Past Surgical History:  Procedure Laterality Date   BACK SURGERY     HIP ARTHROPLASTY Right 03/10/2017   Procedure: ARTHROPLASTY BIPOLAR HIP (HEMIARTHROPLASTY);  Surgeon: Wesley Baker;  Location: [place];  Service: Orthopedics;  Laterality: Right;   KNEE SURGERY     PROSTATE SURGERY      Allergies  No Known Allergies  History of Present Illness    Wesley Baker was last seen by Dr. Audie Baker on 10/04/2018.  During that time he was being seen for a cardiomyopathy evaluation at the request of Wesley Baker.  He reported that over the last several months he had increased lower extremity edema.  At that time he was able to get around his home but was not able to do any strenuous activity.  He was able to shower with assistance however, he was not able to dress himself.  He reported a 20-year history of atrial fibrillation that was maintained by warfarin and metoprolol with no attempt to convert him back to NSR.  He reported increased shortness of breath and limitations in his mobility since 2008.  He underwent hip replacement in 2008 which resulted in severe debility and limited mobility.  Since that time he has been very sedentary.  His lower extremity edema was managed by his PCP with intermittent diuretics.  He has had a goal of care discussion with his pulmonologist and is a DNR.  Intervention of his tricuspid valve was discussed  however, family was not sure they wanted to undergo evaluation for the intervention.  Supportive care was planned at that time.  An echocardiogram from 09/27/2018 showed severe tricuspid regurgitation and moderate mitral regurgitation.  LVEF 55 to 60%.  He presents to the clinic today with his son and daughter and states that they increased Lasix for 3 days and resumed the 40 mg daily Lasix regimen.  However, his weight remained the same and there was not much increase in his urine production.  They were unaware that salt consumption would cause fluid retention and stated they had been encouraging him to consume more p.o. fluids because they felt he was in a dehydrated state.  Education was provided about sodium restriction and fluid restriction.  He denies chest pain, fatigue, palpitations, melena, hematuria, hemoptysis, diaphoresis, weakness, presyncope, syncope, orthopnea, and PND.   Home Medications    Prior to Admission medications   Medication Sig Start Date End Date Taking? Authorizing Provider  albuterol (VENTOLIN HFA) 108 (90 Base) MCG/ACT inhaler Inhale 2 puffs into the lungs every 4 (four) hours as needed for wheezing or shortness of breath. 08/25/18   Wesley Baker  allopurinol (ZYLOPRIM) 300 MG tablet Take 300 mg by mouth daily. 01/15/17   Provider, Historical, Baker  apixaban (ELIQUIS) 5 MG TABS tablet Take 1 tablet (5 mg total) by mouth 2 (two) times daily. 10/04/18   O'Neal, Wesley Freer,  Baker  azithromycin (ZITHROMAX) 250 MG tablet Take two tabs today and then one daily until finished. Patient not taking: Reported on 10/04/2018 09/08/18   Wesley Baker  budesonide-formoterol [facility]) 160-4.5 MCG/ACT inhaler Inhale 2 puffs into the lungs 2 (two) times daily.    Provider, Historical, Baker  Fluticasone-Umeclidin-Vilant (TRELEGY ELLIPTA) 100-62.5-25 MCG/INH AEPB Inhale 1 puff into the lungs daily. 08/25/18   Wesley Baker  furosemide (LASIX) 40 MG tablet Take 40 mg by mouth daily.  May take extra dose depending on weight 01/15/17   Provider, Historical, Baker  gabapentin (NEURONTIN) 600 MG tablet Take 600 mg by mouth 3 (three) times daily. 12/14/16   Provider, Historical, Baker  ipratropium (ATROVENT) 0.06 % nasal spray Place 1 spray into the nose 2 (two) times daily. 01/19/17   Provider, Historical, Baker  ipratropium-albuterol (DUONEB) 0.5-2.5 (3) MG/3ML SOLN Inhale 3 mLs into the lungs 3 (three) times daily. Takes once in the evening 03/07/17   Provider, Historical, Baker  levofloxacin (LEVAQUIN) 750 MG tablet Take 1 tablet (750 mg total) by mouth daily. Patient not taking: Reported on 10/04/2018 09/26/18   Wesley Baker  metoprolol succinate (TOPROL-XL) 100 MG 24 hr tablet Take 50 mg by mouth daily. Only takes 50 mg currently 01/15/17   Provider, Historical, Baker  predniSONE (DELTASONE) 10 MG tablet Take 4tabsx2days, 3tabsx2days, 2tabsx2days, 1tabx2days, then stop 09/08/18   Wesley Baker  predniSONE (DELTASONE) 10 MG tablet Take 4 tablets (40 mg total) by mouth daily with breakfast. Patient not taking: Reported on 10/04/2018 09/25/18   Wesley Baker  umeclidinium bromide (INCRUSE ELLIPTA) 62.5 MCG/INH AEPB Inhale 1 puff into the lungs daily. 08/16/18   Wesley Baker  umeclidinium bromide (INCRUSE ELLIPTA) 62.5 MCG/INH AEPB Inhale 1 puff into the lungs daily. Patient not taking: Reported on 10/04/2018 09/26/18   Wesley Baker    Family History    Family History  Problem Relation Age of Onset   Diabetes Mellitus II Son    Diabetes Son    Heart disease Brother    He indicated that his mother is deceased. He indicated that his father is deceased. He indicated that his sister is deceased. He indicated that his brother is alive. He indicated that the status of his son is unknown.  Social History    Social History   Socioeconomic History   Marital status: Single    Spouse name: Not on file   Number of children: Not on file   Years of education: Not on  file   Highest education level: Not on file  Occupational History   Occupation: retired     Comment: lives with his son, [name]   Social Needs   Emergency planning/management officer strain: Not hard at all   Food insecurity    Worry: Never true    Inability: Never true   Transportation needs    Medical: No    Non-medical: No  Tobacco Use   Smoking status: Former Smoker    Packs/day: 2.00    Years: 48.00    Pack years: 96.00    Types: Cigarettes    Quit date: 12/09/1991    Years since quitting: 26.8   Smokeless tobacco: Never Used  Substance and Sexual Activity   Alcohol use: No    Frequency: Never   Drug use: No   Sexual activity: Not on file  Lifestyle   Physical activity    Days per week: Not on file  Minutes per session: Not on file   Stress: Not on file  Relationships   Social connections    Talks on phone: Not on file    Gets together: Not on file    Attends religious service: Not on file    Active member of club or organization: Not on file    Attends meetings of clubs or organizations: Not on file    Relationship status: Not on file   Intimate partner violence    Fear of current or ex partner: Not on file    Emotionally abused: Not on file    Physically abused: Not on file    Forced sexual activity: Not on file  Other Topics Concern   Not on file  Social History Narrative   Lives at home with son that provides 28 support      Review of Systems    General:  No chills, fever, night sweats or weight changes.  Cardiovascular:  No chest pain, dyspnea on exertion, edema, orthopnea, palpitations, paroxysmal nocturnal dyspnea. Dermatological: No rash, lesions/masses Respiratory: No cough, dyspnea Urologic: No hematuria, dysuria Abdominal:   No nausea, vomiting, diarrhea, bright red blood per rectum, melena, or hematemesis Neurologic:  No visual changes, wkns, changes in mental status. All other systems reviewed and are otherwise negative except as noted  above.  Physical Exam    VS:  BP 128/70    Pulse 60    Temp 97.9 F (36.6 C) (Temporal)    Ht 5\' 5"  (1.651 m)    Wt 201 lb 6.4 oz (91.4 kg)    SpO2 96%    BMI 33.51 kg/m  , BMI Body mass index is 33.51 kg/m. GEN: Well nourished, well developed, in no acute distress. HEENT: normal. Neck: Supple, no JVD, carotid bruits, or masses. Cardiac: RRR, no murmurs, rubs, or gallops. No clubbing, cyanosis, +2 pitting bilateral lower extremity up to thigh edema.  Radials/DP/PT 2+ and equal bilaterally.  Respiratory:  Respirations regular and unlabored, clear to auscultation bilaterally. GI: Soft, nontender, nondistended, BS + x 4. MS: no deformity or atrophy. Skin: warm and dry, no rash. Neuro:  Strength and sensation are intact. Psych: Normal affect.  Accessory Clinical Findings    ECG personally reviewed by me today-none today.   EKG 10/04/2018 Atrial fibrillation left axis deviation 72 bpm  Echocardiogram 09/27/2018 IMPRESSIONS    1. The left ventricle has normal systolic function, with an ejection fraction of 55-60%. The cavity size was normal. There is mildly increased left ventricular wall thickness. Left ventricular diastolic function could not be evaluated secondary to  atrial fibrillation. There is right ventricular volume overload.  2. The mitral valve is degenerative. Moderate thickening of the mitral valve leaflet. Mild calcification of the mitral valve leaflet. There is moderate mitral annular calcification present. Mitral valve regurgitation is mild to moderate by color flow  Doppler. The MR jet is posteriorly-directed.  3. There is mild to moderate MR which is eccentric and directed posteriorly. The PMVL appears calcified and restricted. The degree of MR likely is underestimated due to poor interrogation. Due to the size of the LA and atrial fibrillation, atrial  functional MR could be the etiology of the impressive LA. This also could results in similar pathology for the TV  apparatus (severe TR and huge RA!).  4. Left atrial size was severely dilated.  5. Right atrial size was severely dilated.  6. Trivial pericardial effusion is present.  7. The tricuspid valve is grossly normal. Tricuspid valve  regurgitation is severe.  8. There is severe TR with systolic flow reversal in the hepatic veins.  9. The aortic valve is tricuspid. Mild thickening of the aortic valve. Mild calcification of the aortic valve. No stenosis of the aortic valve. 10. The aorta is normal unless otherwise noted. 11. The aortic root and ascending aorta are normal in size and structure. 12. There is evidence of plaque in the aortic root. 13. The right ventricle has mildly reduced systolic function. The cavity was moderately enlarged. There is no increase in right ventricular wall thickness. Right ventricular systolic pressure is moderately elevated with an estimated pressure of 40.6  mmHg. 14. RVSP is severely underestimated due to severe TR. 15. The inferior vena cava was dilated in size with <50% respiratory variability. 16. When compared to the prior study: No prior study. Assessment & Plan   Permanent atrial fibrillation- heart rate today 60 bpm Continue Eliquis 5 mg twice daily Metoprolol succinate 50 mg daily  Lower extremity edema- +2 pitting bilateral edema to mid thigh.  Weight today 201.4. Stop furosemide  Start torsemide 40 mg daily Start potassium 20 mEq daily Continue use of compression stockings Low-sodium heart healthy diet- educated about low-sodium and salty 6 given Elevate extremities while not active Daily weights. Repeat BMP in 1 week  Disposition: Follow-up with me in 1 week  Deberah Pelton, Baker-C 10/26/2018, 4:43 PM

## 2018-10-26 NOTE — Patient Outreach (Signed)
[place] [facility]) Care Management  10/26/2018  DANIAL RUEFF 12/01/1925 [id number]    Incoming call from patients son, [name] regarding patient assistance medication delivery of Incruse Ellipta and Ventolin HFA, HIPAA identifiers verified. [name] confirms that medications have been received. [name] has questions about how his father should be using the Incruse Ellipta, informed him I would have RPh contact him for consultation.  Follow up:  Will route note to [facility] for case closure of patient assistance and to inform that consultation is needed.  Maud Deed Chana Bode [place] Certified Pharmacy Technician [place] Management Direct Dial:[phone]

## 2018-10-26 NOTE — Telephone Encounter (Signed)
Spoke with pt's son who report pt has need to take an extra 40 mg of lasix for the past 7 days but swelling has since increased. He report pt has gained 5 lbs in 3 days and swelling seems to be all over. He report pt has SOB but can tell breathing is more shallow. He also report he was unable to give pt and extra dose of lasix last night as he feel pt has became dehydrated. He report dark urine and state pt has all the classic signs of dehydrations.  Appointment scheduled for pt to be seen in office for further evaluations toady 10/1 at 2:45 with Almyra Deforest, PA.

## 2018-10-27 ENCOUNTER — Other Ambulatory Visit: Payer: Self-pay | Admitting: Pharmacist

## 2018-10-27 ENCOUNTER — Ambulatory Visit: Payer: Self-pay | Admitting: Pharmacist

## 2018-10-27 NOTE — Patient Outreach (Signed)
[place] [facility]) Care Management  [place] 10/27/2018  BRITTNEY REMUND 1925/11/09 [id number]  Reason for call: f/u on Incruse inhaler administration question  Successful call with patient's son, [name].  [name] reports patient has gained 20lb of fluid over the last month despite being on furosemide and taking extra doses x several days.  Patient had o/v with cardiology yesterday and diuretic changed to torsemide, potassium added.  Patient educated on low-sodium diet.  Reviewed medication changes with son who states patient started on torsemide this morning.  He will weigh patient daily and has f/u appts with cardiology and pulmonary next week.  Due to extra fluid in his lungs, patient is having problems taking a deep, rapid breath to inhale Incruse Ellipta.  Son states patient usually has no problems and does not think he needs to change formulations as hopefully his breathing issues will be resolved shortly.  We reviewed optimum administration technique.  Patient continues to also take duonebs TID.  Encouraged son to reach out to me if he has other questions.    Plan: Will f/u with patient and son again in 1-2 weeks  [name], PharmD, [place] [phone]

## 2018-10-31 ENCOUNTER — Telehealth: Payer: Self-pay | Admitting: Pulmonary Disease

## 2018-10-31 NOTE — Telephone Encounter (Signed)
Lab order in Epic already  Advised ok but come after 11 am when [place]ma will be here  Avoid 12:30-1:30 for lunch

## 2018-11-01 ENCOUNTER — Other Ambulatory Visit: Payer: Self-pay | Admitting: Pharmacist

## 2018-11-01 ENCOUNTER — Ambulatory Visit: Payer: Medicare HMO | Admitting: Pulmonary Disease

## 2018-11-01 ENCOUNTER — Ambulatory Visit: Payer: Self-pay | Admitting: Pharmacist

## 2018-11-01 ENCOUNTER — Telehealth: Payer: Self-pay | Admitting: Pulmonary Disease

## 2018-11-01 ENCOUNTER — Other Ambulatory Visit: Payer: Self-pay

## 2018-11-01 ENCOUNTER — Other Ambulatory Visit (INDEPENDENT_AMBULATORY_CARE_PROVIDER_SITE_OTHER): Payer: Medicare HMO

## 2018-11-01 DIAGNOSIS — J410 Simple chronic bronchitis: Secondary | ICD-10-CM

## 2018-11-01 LAB — BASIC METABOLIC PANEL
BUN: 14 mg/dL (ref 6–23)
CO2: 34 mEq/L — ABNORMAL HIGH (ref 19–32)
Calcium: 9.2 mg/dL (ref 8.4–10.5)
Chloride: 94 mEq/L — ABNORMAL LOW (ref 96–112)
Creatinine, Ser: 1.05 mg/dL (ref 0.40–1.50)
GFR: 66.16 mL/min (ref >60.00)
Glucose, Bld: 100 mg/dL — ABNORMAL HIGH (ref 70–99)
Potassium: 3.9 mEq/L (ref 3.5–5.1)
Sodium: 136 mEq/L (ref 135–145)

## 2018-11-01 NOTE — Progress Notes (Signed)
[facility] cardiology requested patient to have drawn

## 2018-11-01 NOTE — Patient Outreach (Signed)
[facility]) Care Management  [place] 11/01/2018  Wesley Baker 31-Mar-1925 [id number]  Reason for call: f/u on Incruse   Successful call with [name], patient's son.  [name] reports patient has lost 7lb of fluid and is doing better with his breathing since starting torsemide.  He was able to take a deep breath this morning in order to use the Incruse dry powder inhaler.  Son still thinks patient has a lot of extra fluid but is hopeful it will continue to diurese.  He has f/u appt with cardiology tomorrow and appt with pulmonary rescheduled for 10/20.  He plans to bring an empty incruse inhaler to pulmonary appt so patient can demonstrate inhaler technique with provider to assess.  He has no other questions at this time for me.  Provided my contact information if he needs to reach me.   Will close [place] case at this time but am happy to assist in the future as needed.   [name], PharmD, [place] [phone]

## 2018-11-01 NOTE — Telephone Encounter (Signed)
Verbal given from [facility], okay to place order in her name and have results going to ordering provider (NP Denyse Amass cleaver). Nothing further needed at this time.

## 2018-11-02 ENCOUNTER — Ambulatory Visit (INDEPENDENT_AMBULATORY_CARE_PROVIDER_SITE_OTHER): Payer: Medicare HMO | Admitting: General Practice

## 2018-11-02 ENCOUNTER — Encounter: Payer: Self-pay | Admitting: Cardiology

## 2018-11-02 VITALS — BP 142/69 | HR 66 | Ht 66.0 in | Wt 194.6 lb

## 2018-11-02 DIAGNOSIS — I4821 Permanent atrial fibrillation: Secondary | ICD-10-CM | POA: Diagnosis not present

## 2018-11-02 DIAGNOSIS — Z23 Encounter for immunization: Secondary | ICD-10-CM

## 2018-11-02 DIAGNOSIS — R600 Localized edema: Secondary | ICD-10-CM

## 2018-11-02 NOTE — Patient Instructions (Addendum)
Medication Instructions:  Your physician recommends that you continue on your current medications as directed. Please refer to the Current Medication list given to you today. If you need a refill on your cardiac medications before your next appointment, please call your pharmacy.   Lab work: None  If you have labs (blood work) drawn today and your tests are completely normal, you will receive your results only by: Marland Kitchen MyChart Message (if you have MyChart) OR . A paper copy in the mail If you have any lab test that is abnormal or we need to change your treatment, we will call you to review the results.  Testing/Procedures: None   Follow-Up: At [facility], you and your health needs are our priority.  As part of our continuing mission to provide you with exceptional heart care, we have created designated Provider Care Teams.  These Care Teams include your primary Cardiologist (physician) and Advanced Practice Providers (APPs -  Physician Assistants and Nurse Practitioners) who all work together to provide you with the care you need, when you need it.  . Your physician recommends that you schedule a follow-up appointment in: 2 weeks with Coletta Memos, NP  Any Other Special Instructions Will Be Listed Below (If Applicable). Flu vaccine given today Salty 6 handout given today

## 2018-11-02 NOTE — Progress Notes (Signed)
Cardiology Clinic Note   Patient Name: Wesley Baker Date of Encounter: 11/02/2018  Primary Care Provider:  Maryella Shivers, Baker Primary Cardiologist:  Wesley Field, Baker  Patient Profile    Wesley Baker 85 year old male presents today for follow-up of his atrial fibrillation and lower extremity edema.  Past Medical History    Past Medical History:  Diagnosis Date   COPD (chronic obstructive pulmonary disease) ([place])    Dysrhythmia    Gout    Hypertension    Pneumonia    recent and recurring over severalyears   Sleep apnea    Past Surgical History:  Procedure Laterality Date   BACK SURGERY     HIP ARTHROPLASTY Right 03/10/2017   Procedure: ARTHROPLASTY BIPOLAR HIP (HEMIARTHROPLASTY);  Surgeon: Wesley Butters, Baker;  Location: [facility];  Service: Orthopedics;  Laterality: Right;   KNEE SURGERY     PROSTATE SURGERY      Allergies  No Known Allergies  History of Present Illness    Wesley Baker last seen by me on 10/26/2018.  During that time he was experiencing increased lower extremity edema.  His furosemide was discontinued and he was started on torsemide 40 mg daily with 20 mEq of potassium daily.  Him and his family were educated about the importance of sodium restriction in her diet, fluid restriction, and closely monitoring daily weights.   He has a reported 20-year history of atrial fibrillation that has been maintained by warfarin and metoprolol with no attempt to convert him back to NSR.  He had been reporting increased shortness of breath and limitations in his mobility since 2008.  He underwent hip replacement in 2008 which resulted in increased debility and increased limitations to his mobility.  His lower extremity edema was managed by his PCP with intermittent diuretics.  He and his family had a goals of care discussion with the pulmonologist and he has a DNR.  During a previous appointment with Wesley Baker in discussion about intervention of his  tricuspid valve was had however, family does not want to undergo this intervention.  His echocardiogram from 09/27/2018 showed severe tricuspid regurgitation and moderate mitral regurgitation.  LVEF 55 to 60%.  He presents to the clinic today and states he is starting to feel better.  His breathing feels easier.  He feels his dry weight is around 187 pounds however his son states that it is closer to 183 pounds.  He continues to do his daily weights and follow a low-sodium diet.  However, he states that he does not like the way food tastes without salt.  He denies chest pain, increased shortness of breath, fatigue, palpitations, melena, hematuria, hemoptysis, diaphoresis, weakness, presyncope, syncope, orthopnea, and PND.   Home Medications    Prior to Admission medications   Medication Sig Start Date End Date Taking? Authorizing Provider  albuterol (VENTOLIN HFA) 108 (90 Base) MCG/ACT inhaler Inhale 2 puffs into the lungs every 4 (four) hours as needed for wheezing or shortness of breath. 08/25/18   Wesley Baker  allopurinol (ZYLOPRIM) 300 MG tablet Take 300 mg by mouth daily. 01/15/17   Provider, Historical, Baker  apixaban (ELIQUIS) 5 MG TABS tablet Take 1 tablet (5 mg total) by mouth 2 (two) times daily. 10/04/18   Wesley Baker  azithromycin (ZITHROMAX) 250 MG tablet Take two tabs today and then one daily until finished. Patient not taking: Reported on 10/04/2018 09/08/18   Wesley Baker  furosemide (LASIX) 40  MG tablet Take 40 mg by mouth daily. May take extra dose depending on weight 01/15/17   Provider, Historical, Baker  gabapentin (NEURONTIN) 600 MG tablet Take 600 mg by mouth 3 (three) times daily. 12/14/16   Provider, Historical, Baker  ipratropium (ATROVENT) 0.06 % nasal spray Place 1 spray into the nose 2 (two) times daily. 01/19/17   Provider, Historical, Baker  ipratropium-albuterol (DUONEB) 0.5-2.5 (3) MG/3ML SOLN Inhale 3 mLs into the lungs 3 (three) times daily. Takes once in  the evening 03/07/17   Provider, Historical, Baker  levofloxacin (LEVAQUIN) 750 MG tablet Take 1 tablet (750 mg total) by mouth daily. Patient not taking: Reported on 10/04/2018 09/26/18   Wesley Baker  metoprolol succinate (TOPROL-XL) 100 MG 24 hr tablet Take 50 mg by mouth daily. Only takes 50 mg currently 01/15/17   Provider, Historical, Baker  potassium chloride SA (KLOR-CON) 20 MEQ tablet Take 1 tablet (20 mEq total) by mouth daily. 10/26/18   Wesley Baker  predniSONE (DELTASONE) 10 MG tablet Take 4 tablets (40 mg total) by mouth daily with breakfast. Patient not taking: Reported on 10/04/2018 09/25/18   Wesley Baker  torsemide (DEMADEX) 20 MG tablet Take 2 tablets (40 mg total) by mouth daily. 10/26/18   Wesley Baker  umeclidinium bromide (INCRUSE ELLIPTA) 62.5 MCG/INH AEPB Inhale 1 puff into the lungs daily. 09/26/18   Wesley Baker    Family History    Family History  Problem Relation Age of Onset   Diabetes Mellitus II Son    Diabetes Son    Heart disease Brother    He indicated that his mother is deceased. He indicated that his father is deceased. He indicated that his sister is deceased. He indicated that his brother is alive. He indicated that the status of his son is unknown.  Social History    Social History   Socioeconomic History   Marital status: Single    Spouse name: Not on file   Number of children: Not on file   Years of education: Not on file   Highest education level: Not on file  Occupational History   Occupation: retired     Comment: lives with his son, Wesley Baker   Social Needs   Emergency planning/management officer strain: Not hard at all   Food insecurity    Worry: Never true    Inability: Never true   Transportation needs    Medical: No    Non-medical: No  Tobacco Use   Smoking status: Former Smoker    Packs/day: 2.00    Years: 48.00    Pack years: 96.00    Types: Cigarettes    Quit date: 12/09/1991    Years since quitting: 26.9     Smokeless tobacco: Never Used  Substance and Sexual Activity   Alcohol use: No    Frequency: Never   Drug use: No   Sexual activity: Not on file  Lifestyle   Physical activity    Days per week: Not on file    Minutes per session: Not on file   Stress: Not on file  Relationships   Social connections    Talks on phone: Not on file    Gets together: Not on file    Attends religious service: Not on file    Active member of club or organization: Not on file    Attends meetings of clubs or organizations: Not on file    Relationship status: Not on  file   Intimate partner violence    Fear of current or ex partner: Not on file    Emotionally abused: Not on file    Physically abused: Not on file    Forced sexual activity: Not on file  Other Topics Concern   Not on file  Social History Narrative   Lives at home with son that provides 23 support      Review of Systems    General:  No chills, fever, night sweats or weight changes.  Cardiovascular:  No chest pain, dyspnea on exertion, edema, orthopnea, palpitations, paroxysmal nocturnal dyspnea. Dermatological: No rash, lesions/masses Respiratory: No cough, dyspnea Urologic: No hematuria, dysuria Abdominal:   No nausea, vomiting, diarrhea, bright red blood per rectum, melena, or hematemesis Neurologic:  No visual changes, wkns, changes in mental status. All other systems reviewed and are otherwise negative except as noted above.  Physical Exam    VS:  BP (!) 142/69    Pulse 66    Ht 5\' 6"  (1.676 m)    Wt 194 lb 9.6 oz (88.3 kg)    BMI 31.41 kg/m  , BMI Body mass index is 31.41 kg/m. GEN: Well nourished, well developed, in no acute distress. HEENT: normal. Neck: Supple, no JVD, carotid bruits, or masses. Cardiac: RRR, no murmurs, rubs, or gallops. No clubbing, cyanosis, +2 pitting bilateral lower extremity edem up to his knee a.  Radials/DP/PT 2+ and equal bilaterally.  Respiratory:  Respirations regular and unlabored,  clear to auscultation bilaterally. GI: Soft, nontender, nondistended, BS + x 4. MS: no deformity or atrophy. Skin: warm and dry, no rash. Neuro:  Strength and sensation are intact. Psych: Normal affect.  Accessory Clinical Findings    ECG personally reviewed by me today-no EKG today.  EKG 10/04/2018 Atrial fibrillation left axis deviation 72 bpm  Echocardiogram 09/27/2018 IMPRESSIONS   1. The left ventricle has normal systolic function, with an ejection fraction of 55-60%. The cavity size was normal. There is mildly increased left ventricular wall thickness. Left ventricular diastolic function could not be evaluated secondary to  atrial fibrillation. There is right ventricular volume overload. 2. The mitral valve is degenerative. Moderate thickening of the mitral valve leaflet. Mild calcification of the mitral valve leaflet. There is moderate mitral annular calcification present. Mitral valve regurgitation is mild to moderate by color flow  Doppler. The MR jet is posteriorly-directed. 3. There is mild to moderate MR which is eccentric and directed posteriorly. The PMVL appears calcified and restricted. The degree of MR likely is underestimated due to poor interrogation. Due to the size of the LA and atrial fibrillation, atrial  functional MR could be the etiology of the impressive LA. This also could results in similar pathology for the TV apparatus (severe TR and huge RA!). 4. Left atrial size was severely dilated. 5. Right atrial size was severely dilated. 6. Trivial pericardial effusion is present. 7. The tricuspid valve is grossly normal. Tricuspid valve regurgitation is severe. 8. There is severe TR with systolic flow reversal in the hepatic veins. 9. The aortic valve is tricuspid. Mild thickening of the aortic valve. Mild calcification of the aortic valve. No stenosis of the aortic valve. 10. The aorta is normal unless otherwise noted. 11. The aortic root and ascending  aorta are normal in size and structure. 12. There is evidence of plaque in the aortic root. 13. The right ventricle has mildly reduced systolic function. The cavity was moderately enlarged. There is no increase in right ventricular wall  thickness. Right ventricular systolic pressure is moderately elevated with an estimated pressure of 40.6  mmHg. 14. RVSP is severely underestimated due to severe TR. 15. The inferior vena cava was dilated in size with <50% respiratory variability. 16. When compared to the prior study: No prior study.  Assessment & Plan   Lower extremity edema-  weight today 194.6 from 201.4 pounds on 10/26/2018.  Patient's dry weight around 183 to 185 pounds Continue torsemide 40 mg  daily Continue potassium 20 mEq daily Continue use of compression stockings Low-sodium heart healthy diet- educated about low-sodium and salty 6 reinforced Continue to elevate extremities while not active Continue Daily weights.  Permanent atrial fibrillation- heart rate today 66 Continue Eliquis 5 mg twice daily Metoprolol succinate 50 mg daily   Give flu shot today.  Patient to have BMP drawn by pulmonologist office in the next 2 weeks.  Disposition: Follow-up with me in 2 weeks.  Wesley Baker-C 11/02/2018, 3:29 PM

## 2018-11-14 ENCOUNTER — Ambulatory Visit: Payer: Medicare HMO | Admitting: Pulmonary Disease

## 2018-11-14 ENCOUNTER — Encounter: Payer: Self-pay | Admitting: Pulmonary Disease

## 2018-11-14 ENCOUNTER — Other Ambulatory Visit: Payer: Self-pay

## 2018-11-14 VITALS — BP 122/60 | HR 80 | Temp 97.4°F | Ht 66.0 in | Wt 185.6 lb

## 2018-11-14 DIAGNOSIS — R0681 Apnea, not elsewhere classified: Secondary | ICD-10-CM

## 2018-11-14 DIAGNOSIS — J449 Chronic obstructive pulmonary disease, unspecified: Secondary | ICD-10-CM

## 2018-11-14 LAB — BASIC METABOLIC PANEL
BUN: 13 mg/dL (ref 6–23)
CO2: 33 mEq/L — ABNORMAL HIGH (ref 19–32)
Calcium: 9.1 mg/dL (ref 8.4–10.5)
Chloride: 94 mEq/L — ABNORMAL LOW (ref 96–112)
Creatinine, Ser: 0.95 mg/dL (ref 0.40–1.50)
GFR: 74.25 mL/min (ref >60.00)
Glucose, Bld: 97 mg/dL (ref 70–99)
Potassium: 4 mEq/L (ref 3.5–5.1)
Sodium: 134 mEq/L — ABNORMAL LOW (ref 135–145)

## 2018-11-14 MED ORDER — BUDESONIDE-FORMOTEROL FUMARATE 160-4.5 MCG/ACT IN AERO: 2.0000 | INHALATION_SPRAY | Freq: Two times a day (BID) | RESPIRATORY_TRACT | 6 refills | Status: DC

## 2018-11-14 NOTE — Patient Instructions (Addendum)
COPD RESTART Symbicort 160-4.5 mcg two puffs twice a day CONTINUE Incruse once daily AS NEEDED Duonebs for shortness of breath or wheezing  Witnessed apnea, confusion, excessive day time sleepiness Home sleep study ordered  Will obtain BMP today and send results to your cardiologist  Follow-up with me in 3 months

## 2018-11-14 NOTE — Progress Notes (Signed)
Subjective:   PATIENT ID: Wesley Baker GENDER: male DOB: April 05, 1925, MRN: [id number]   HPI  Chief Complaint  Patient presents with  . Follow-up    shortness of breath in the am when he gets out of bed and walks to bathroom O2 drops   Wesley Baker is a 85 year-old male former smoker with COPD, severe tricuspid regurgitation, atrial fibrillation and HTN who presents for follow-up. Son is caregiver and presents all the history.  Since our last visit, his son reports he often wakes up every morning confused and incoherent and this can last from one to several hours. He has been evaluated by his PCP and vitals are normal. His oxygen saturations are at home are checked and range from 92-98% with transfers/ambulation. Not currently on oxygen. When sleeping, his son does report witnessed episodes of apnea that last 10-15 seconds long.   He is sedentary at baseline. Has shortness of breath with any activity. He is compliant with his home medications including inhalers and diuretics. He has lost weight (~20lbs) after being started on torsemide and has improvement in his lower extremity swelling. Denies chest pain, wheezing, coughing, pnd.  Social History: 96-pack-year history.  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  Past Medical History:  Diagnosis Date  . COPD (chronic obstructive pulmonary disease) ([place])   . Dysrhythmia   . Gout   . Hypertension   . Pneumonia    recent and recurring over severalyears  . Sleep apnea      Family History  Problem Relation Age of Onset  . Diabetes Mellitus II Son   . Diabetes Son   . Heart disease Brother      Social History   Occupational History  . Occupation: retired     Comment: lives with his son, [name]   Tobacco  Use  . Smoking status: Former Smoker    Packs/day: 2.00    Years: 48.00    Pack years: 96.00    Types: Cigarettes    Quit date: 12/09/1991    Years since quitting: 26.9  . Smokeless tobacco: Never Used  Substance and Sexual Activity  . Alcohol use: No    Frequency: Never  . Drug use: No  . Sexual activity: Not on file    No Known Allergies   Outpatient Medications Prior to Visit  Medication Sig Dispense Refill  . albuterol (VENTOLIN HFA) 108 (90 Base) MCG/ACT inhaler Inhale 2 puffs into the lungs every 4 (four) hours as needed for wheezing or shortness of breath. 18 g 3  . allopurinol (ZYLOPRIM) 300 MG tablet Take 300 mg by mouth daily.    Marland Kitchen apixaban (ELIQUIS) 5 MG TABS tablet Take 1 tablet (5 mg total) by mouth 2 (two) times daily. 60 tablet 6  . gabapentin (NEURONTIN) 600 MG tablet Take 600 mg by mouth 3 (three) times daily.    Marland Kitchen ipratropium (ATROVENT) 0.06 % nasal spray Place 1 spray into the nose 2 (two) times daily.    Marland Kitchen ipratropium-albuterol (DUONEB) 0.5-2.5 (3) MG/3ML SOLN Inhale 3 mLs into the lungs 3 (three) times daily. Takes once in the evening    . levofloxacin (LEVAQUIN) 750 MG tablet Take 1 tablet (750 mg total) by mouth daily. 7 tablet 0  . metoprolol succinate (TOPROL-XL) 100 MG 24 hr tablet Take 50 mg by  mouth daily. Only takes 50 mg currently    . potassium chloride SA (KLOR-CON) 20 MEQ tablet Take 1 tablet (20 mEq total) by mouth daily. 90 tablet 3  . torsemide (DEMADEX) 20 MG tablet Take 2 tablets (40 mg total) by mouth daily. (Patient taking differently: Take 40 mg by mouth daily. If weight goes up 4lbs gives an extra pill) 180 tablet 3  . umeclidinium bromide (INCRUSE ELLIPTA) 62.5 MCG/INH AEPB Inhale 1 puff into the lungs daily. 3 each 3  . azithromycin (ZITHROMAX) 250 MG tablet Take two tabs today and then one daily until finished. 6 tablet 0  . predniSONE (DELTASONE) 10 MG tablet Take 4 tablets (40 mg total) by mouth daily with breakfast. 20 tablet 0   No  facility-administered medications prior to visit.    Review of Systems  Constitutional: Positive for malaise/fatigue. Negative for chills, diaphoresis, fever and weight loss.  HENT: Negative for congestion, ear pain and sore throat.   Respiratory: Positive for shortness of breath. Negative for cough, hemoptysis, sputum production and wheezing.   Cardiovascular: Negative for chest pain, palpitations and leg swelling.  Gastrointestinal: Negative for abdominal pain, heartburn and nausea.  Genitourinary: Negative for frequency.  Musculoskeletal: Negative for joint pain and myalgias.  Skin: Negative for itching and rash.  Neurological: Negative for dizziness, weakness and headaches.       Morning confusion  Endo/Heme/Allergies: Does not bruise/bleed easily.  Psychiatric/Behavioral: Negative for depression. The patient is not nervous/anxious.      Objective:   Vitals:   11/14/18 1040 11/14/18 1041  BP:  122/60  Pulse:  80  Temp: (!) 97.4 F (36.3 C)   TempSrc: Temporal   SpO2:  98%  Weight: 185 lb 9.6 oz (84.2 kg)   Height: 5\' 6"  (1.676 m)    SpO2: 98 % O2 Device: None (Room air)  Physical Exam: General: Chronically ill-appearing elderly male, no acute distress HENT: [place], AT, bilateral hearing loss Eyes: EOMI, no scleral icterus Respiratory: Decreased breath sounds bilaterally.  No crackles, wheezing or rales Cardiovascular: RRR, -M/R/G, no JVD GI: BS+, soft, nontender Extremities:-Edema,-tenderness Neuro: AAO x4, CNII-XII grossly intact Skin: Intact, no rashes or bruising Psych: Normal mood, normal affect  Data Reviewed:  Chest Imaging CXR 09/25/18 - RLL infiltrate CT chest 02/03/2018-resolution of lower lobe airspace disease and effusions.  Unchanged 5 mm calcified lung nodule in the right upper lobe CT chest 11/25/2017-bilateral nodular and masslike consolidations predominantly in the lower lobes.  Emphysema CXR 02/20/18 - Emphysema, cardiomegaly. No acute infiltrate or  edema.  PFT 02/01/18 FVC 2.22 (62%) FEV1 1.38 (56%) Ratio 64 DLCO 38% Interpretation: Moderately severe obstructive defect with severely reduced DLCO suggestive of emphysema  Labs CBC    Component Value Date/Time   WBC 8.3 03/12/2017 0442   RBC 4.02 (L) 03/12/2017 0442   HGB 12.0 (L) 03/12/2017 0442   HCT 37.2 (L) 03/12/2017 0442   PLT 180 03/12/2017 0442   MCV 92.5 03/12/2017 0442   MCH 29.9 03/12/2017 0442   MCHC 32.3 03/12/2017 0442   RDW 14.8 03/12/2017 0442   LYMPHSABS 0.3 (L) 03/10/2017 0412   MONOABS 0.6 03/10/2017 0412   EOSABS 0.0 03/10/2017 0412   BASOSABS 0.0 03/10/2017 0412   BMET    Component Value Date/Time   NA 134 (L) 11/14/2018 1142   NA 137 10/04/2018 1534   K 4.0 11/14/2018 1142   CL 94 (L) 11/14/2018 1142   CO2 33 (H) 11/14/2018 1142   GLUCOSE 97 11/14/2018 1142  BUN 13 11/14/2018 1142   BUN 17 10/04/2018 1534   CREATININE 0.95 11/14/2018 1142   CALCIUM 9.1 11/14/2018 1142   GFRNONAA 72 10/04/2018 1534   GFRAA 84 10/04/2018 1534     Imaging, labs and test noted above have been reviewed independently by me.    Assessment & Plan:   Discussion: 85 year-old male former smoker with COPD, severe tricuspid regurgitation, atrial fibrillation and HTN who presents for follow-up. Improving on diuretic management. Discussed inhaler regimen and inhaler technique. Recommend triple therapy with ICS, LABA and LAMA to optimize his COPD management. For his apnea and confusion, will evaluate for possible OSA. If study negative, will plan for ABG to rule out chronic hypercapnia that may be contributing his recent symptoms. He would likely benefit from NIV for his COPD if this is the case.   Moderately severe COPD RESTART Symbicort 160-4.5 mcg two puffs twice a day CONTINUE Incruse once daily AS NEEDED Duonebs for shortness of breath or wheezing  Witnessed apnea, confusion, excessive day time sleepiness Home sleep study ordered  DNR, however wishes for  mechanical ventilation if needed Per prior discussion, patient would NOT want chest compressions or CPR. However would be willing to be on a ventilator if needed. Please continue to discuss this decision with your family.  Orders Placed This Encounter  Procedures  . Basic Metabolic Panel (BMET)    Standing Status:   Future    Number of Occurrences:   1    Standing Expiration Date:   11/14/2019  . Home sleep test    Standing Status:   Future    Standing Expiration Date:   11/14/2019    Order Specific Question:   Where should this test be performed:    Answer:   [facility] - [facility]   Meds ordered this encounter  Medications  . budesonide-formoterol (SYMBICORT) 160-4.5 MCG/ACT inhaler    Sig: Inhale 2 puffs into the lungs 2 (two) times daily.    Dispense:  1 Inhaler    Refill:  6    Return in about 3 months (around 02/14/2019).  Greater than 50% of this patient 25-minute office visit was spent face-to-face in counseling with the patient/family. We discussed medical diagnosis and treatment plan as noted.  Najwa Spillane Rodman Pickle, MD [facility] 11/14/2018 11:01 AM  Personal pager: [phone] If unanswered, please page CCM On-call: [phone]

## 2018-11-17 ENCOUNTER — Ambulatory Visit (INDEPENDENT_AMBULATORY_CARE_PROVIDER_SITE_OTHER): Payer: Medicare HMO | Admitting: Cardiology

## 2018-11-17 ENCOUNTER — Encounter: Payer: Self-pay | Admitting: Cardiology

## 2018-11-17 ENCOUNTER — Other Ambulatory Visit: Payer: Self-pay

## 2018-11-17 VITALS — BP 134/66 | HR 63 | Temp 97.0°F | Ht 66.0 in | Wt 182.0 lb

## 2018-11-17 DIAGNOSIS — I4821 Permanent atrial fibrillation: Secondary | ICD-10-CM

## 2018-11-17 DIAGNOSIS — I071 Rheumatic tricuspid insufficiency: Secondary | ICD-10-CM | POA: Diagnosis not present

## 2018-11-17 DIAGNOSIS — Z7901 Long term (current) use of anticoagulants: Secondary | ICD-10-CM | POA: Insufficient documentation

## 2018-11-17 DIAGNOSIS — R600 Localized edema: Secondary | ICD-10-CM | POA: Diagnosis not present

## 2018-11-17 DIAGNOSIS — J42 Unspecified chronic bronchitis: Secondary | ICD-10-CM

## 2018-11-17 NOTE — Assessment & Plan Note (Signed)
Warfarin CHADS VASC=3

## 2018-11-17 NOTE — Assessment & Plan Note (Signed)
Rate controlled 

## 2018-11-17 NOTE — Assessment & Plan Note (Signed)
Echo 09/27/2018- EF 55-60% with severe TR

## 2018-11-17 NOTE — Assessment & Plan Note (Signed)
Dr Loanne Drilling follows- pt is a modified DNR- no CPR but intubation if needed

## 2018-11-17 NOTE — Patient Instructions (Signed)
Medication Instructions:  Your physician recommends that you continue on your current medications as directed. Please refer to the Current Medication list given to you today. *If you need a refill on your cardiac medications before your next appointment, please call your pharmacy*  Lab Work: None  If you have labs (blood work) drawn today and your tests are completely normal, you will receive your results only by: Marland Kitchen MyChart Message (if you have MyChart) OR . A paper copy in the mail If you have any lab test that is abnormal or we need to change your treatment, we will call you to review the results.  Testing/Procedures: None   Follow-Up: At [facility], you and your health needs are our priority.  As part of our continuing mission to provide you with exceptional heart care, we have created designated Provider Care Teams.  These Care Teams include your primary Cardiologist (physician) and Advanced Practice Providers (APPs -  Physician Assistants and Nurse Practitioners) who all work together to provide you with the care you need, when you need it.  Your next appointment:   AS SCHEDULED   The format for your next appointment:   In Person  Provider:   Eleonore Chiquito, MD  Other Instructions IF YOUR WEIGHT GOES DOWN TO 180 HOLD YOUR WATER PILL IF YOUR WEIGHT GOES UP TO 190 DOUBLE YOUR WATER PILL  YOUR GOAL WEIGHT IS 185

## 2018-11-17 NOTE — Assessment & Plan Note (Signed)
Chronic LE edema secondary to diastolic CHF and TR I suggested his goal weight be 185 lbs

## 2018-11-17 NOTE — Progress Notes (Signed)
Cardiology Office Note:    Date:  11/17/2018   ID:  Wesley Baker, DOB 12/03/1925, MRN [id number]  PCP:  Maryella Shivers, MD  Cardiologist:  Evalina Field, MD  Electrophysiologist:  None   Referring MD: Maryella Shivers, MD   No chief complaint on file. F/U of LE edema  History of Present Illness:    Wesley Baker is a 85 y.o. 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  The patient was seen in the office 10/26/2018.  His furosemide was changed to torsemide.  He presents to the office today for follow-up.  In the interim he has been seen by Dr. Loanne Drilling his pulmonologist.  CODE STATUS was discussed, apparently at this time the patient does not want CPR but would agree to intubation short-term if needed.  His son accompanied him to the office.  He says the patient is doing well, his edema is much improved.  His weight on October 1 was 200 pounds.  He is now down to 182.  His son is actually cut his torsemide back to 20 mg a day.  I suggested he continue this dose and less the patient's weight drops below 180 at that point I would cut it back to 3 times a week or even hold it until his weight came up a little.  Inversely if the patient's weight gets closer to 190 then he should increase the dose to 40 mg a day until it gets closer to 185.  The son understands this philosophy.  The patient did have a renal profile done 11/14/2018.  His BUN was 13 creatinine 0.9 and potassium 4.  Past Medical History:  Diagnosis Date  . COPD (chronic obstructive pulmonary disease) ([place])   . Dysrhythmia   . Gout   . Hypertension   . Pneumonia    recent and recurring over severalyears  . Sleep apnea     Past Surgical History:  Procedure Laterality Date  . BACK SURGERY    . HIP ARTHROPLASTY Right 03/10/2017   Procedure: ARTHROPLASTY BIPOLAR HIP (HEMIARTHROPLASTY);  Surgeon: Renette Butters,  MD;  Location: [facility];  Service: Orthopedics;  Laterality: Right;  . KNEE SURGERY    . PROSTATE SURGERY      Current Medications: Current Meds  Medication Sig  . albuterol (VENTOLIN HFA) 108 (90 Base) MCG/ACT inhaler Inhale 2 puffs into the lungs every 4 (four) hours as needed for wheezing or shortness of breath.  . allopurinol (ZYLOPRIM) 300 MG tablet Take 300 mg by mouth daily.  Marland Kitchen apixaban (ELIQUIS) 5 MG TABS tablet Take 1 tablet (5 mg total) by mouth 2 (two) times daily.  . budesonide-formoterol (SYMBICORT) 160-4.5 MCG/ACT inhaler Inhale 2 puffs into the lungs 2 (two) times daily.  Marland Kitchen gabapentin (NEURONTIN) 600 MG tablet Take 600 mg by mouth 3 (three) times daily.  Marland Kitchen ipratropium (ATROVENT) 0.06 % nasal spray Place 1 spray into the nose 2 (two) times daily.  Marland Kitchen ipratropium-albuterol (DUONEB) 0.5-2.5 (3) MG/3ML SOLN Inhale 3 mLs into the lungs 3 (three) times daily. Takes once in the evening  . levofloxacin (LEVAQUIN) 750 MG tablet Take 1 tablet (750 mg total) by mouth daily.  . metoprolol succinate (TOPROL-XL) 100 MG 24 hr tablet Take 50 mg by mouth daily. Only takes 50 mg currently  . potassium chloride SA (KLOR-CON) 20 MEQ tablet Take 1 tablet (20 mEq total) by mouth daily.  Marland Kitchen  torsemide (DEMADEX) 20 MG tablet Take 2 tablets (40 mg total) by mouth daily. (Patient taking differently: Take 40 mg by mouth daily. If weight goes up 4lbs gives an extra pill)  . umeclidinium bromide (INCRUSE ELLIPTA) 62.5 MCG/INH AEPB Inhale 1 puff into the lungs daily.     Allergies:   Patient has no known allergies.   Social History   Socioeconomic History  . Marital status: Single    Spouse name: Not on file  . Number of children: Not on file  . Years of education: Not on file  . Highest education level: Not on file  Occupational History  . Occupation: retired     Comment: lives with his son, [name]   Social Needs  . Financial resource strain: Not hard at all  . Food insecurity    Worry: Never true     Inability: Never true  . Transportation needs    Medical: No    Non-medical: No  Tobacco Use  . Smoking status: Former Smoker    Packs/day: 2.00    Years: 48.00    Pack years: 96.00    Types: Cigarettes    Quit date: 12/09/1991    Years since quitting: 26.9  . Smokeless tobacco: Never Used  Substance and Sexual Activity  . Alcohol use: No    Frequency: Never  . Drug use: No  . Sexual activity: Not on file  Lifestyle  . Physical activity    Days per week: Not on file    Minutes per session: Not on file  . Stress: Not on file  Relationships  . Social Herbalist on phone: Not on file    Gets together: Not on file    Attends religious service: Not on file    Active member of club or organization: Not on file    Attends meetings of clubs or organizations: Not on file    Relationship status: Not on file  Other Topics Concern  . Not on file  Social History Narrative   Lives at home with son that provides 79 support      Family History: The patient's family history includes Diabetes in his son; Diabetes Mellitus II in his son; Heart disease in his brother.  ROS:   Please see the history of present illness.     All other systems reviewed and are negative.  EKGs/Labs/Other Studies Reviewed:    The following studies were reviewed today: Echo 09/27/2018  Recent Labs: 10/04/2018: ALT 16 11/14/2018: BUN 13; Creatinine, Ser 0.95; Potassium 4.0; Sodium 134  Recent Lipid Panel No results found for: CHOL, TRIG, HDL, CHOLHDL, VLDL, LDLCALC, LDLDIRECT  Physical Exam:    VS:  BP 134/66   Pulse 63   Temp (!) 97 F (36.1 C) (Temporal)   Ht 5\' 6"  (1.676 m)   Wt 182 lb (82.6 kg)   SpO2 99%   BMI 29.38 kg/m     Wt Readings from Last 3 Encounters:  11/17/18 182 lb (82.6 kg)  11/14/18 185 lb 9.6 oz (84.2 kg)  11/02/18 194 lb 9.6 oz (88.3 kg)     GEN: Elderly Caucasian male, in wheel chair, well developed in no acute distress HEENT: Normal NECK: No JVD; No carotid  bruits LYMPHATICS: No lymphadenopathy CARDIAC: irregularly irregular, 2/6 TR murmur, rubs, gallops RESPIRATORY:  Clear to auscultation without rales, wheezing or rhonchi  ABDOMEN: Soft, non-tender, non-distended MUSCULOSKELETAL:  1+ LE edema with support stockings in place, No deformity  SKIN: Warm  and dry NEUROLOGIC:  Alert and oriented x 3 PSYCHIATRIC:  Normal affect   ASSESSMENT:    Edema of both lower legs Chronic LE edema secondary to diastolic CHF and TR I suggested his goal weight be 185 lbs  Permanent atrial fibrillation (HCC) Rate controlled  Chronic anticoagulation Warfarin CHADS VASC=3  GOLD COPD II B Dr Loanne Drilling follows- pt is a modified DNR- no CPR but intubation if needed  Severe tricuspid regurgitation Echo 09/27/2018- EF 55-60% with severe TR  PLAN:    Goal weight 185 lbs.  Pt's son will adjust torsemide to attain this. Keep f/u with dr Audie Box as scheduled.    Medication Adjustments/Labs and Tests Ordered: Current medicines are reviewed at length with the patient today.  Concerns regarding medicines are outlined above.  No orders of the defined types were placed in this encounter.  No orders of the defined types were placed in this encounter.   Patient Instructions  Medication Instructions:  Your physician recommends that you continue on your current medications as directed. Please refer to the Current Medication list given to you today. *If you need a refill on your cardiac medications before your next appointment, please call your pharmacy*  Lab Work: None  If you have labs (blood work) drawn today and your tests are completely normal, you will receive your results only by: Marland Kitchen MyChart Message (if you have MyChart) OR . A paper copy in the mail If you have any lab test that is abnormal or we need to change your treatment, we will call you to review the results.  Testing/Procedures: None   Follow-Up: At [facility], you and your health needs are  our priority.  As part of our continuing mission to provide you with exceptional heart care, we have created designated Provider Care Teams.  These Care Teams include your primary Cardiologist (physician) and Advanced Practice Providers (APPs -  Physician Assistants and Nurse Practitioners) who all work together to provide you with the care you need, when you need it.  Your next appointment:   AS SCHEDULED   The format for your next appointment:   In Person  Provider:   Eleonore Chiquito, MD  Other Instructions IF YOUR WEIGHT GOES DOWN TO 180 HOLD YOUR WATER PILL IF YOUR WEIGHT GOES UP TO 190 DOUBLE YOUR WATER PILL  YOUR GOAL WEIGHT IS [address] Kerin Ransom, PA-C  11/17/2018 2:12 PM    [facility]

## 2018-11-24 ENCOUNTER — Encounter: Payer: Self-pay | Admitting: Internal Medicine

## 2018-11-27 ENCOUNTER — Telehealth: Payer: Self-pay | Admitting: Pulmonary Disease

## 2018-11-28 ENCOUNTER — Encounter: Payer: Self-pay | Admitting: Pulmonary Disease

## 2018-11-28 NOTE — Telephone Encounter (Addendum)
I have tried to reach this patient 5 times to schedule this appointment.  Call is answered, but there is no one on the line.  If pt calls back, please send the call to me at ext [phone].

## 2018-11-28 NOTE — Progress Notes (Signed)
Overnight Oximetry Report  Reviewed report. SpO2<88% for 59min 52 seconds. Nadir O2 82% Patient qualifies for nocturnal oxygen.   Will arrange for 2L O2 via [employer] while sleeping.   Rodman Pickle, M.D. [facility] Pulmonary/Critical Care Medicine 11/28/2018 12:38 PM

## 2018-12-01 ENCOUNTER — Telehealth: Payer: Self-pay | Admitting: *Deleted

## 2018-12-01 DIAGNOSIS — J9611 Chronic respiratory failure with hypoxia: Secondary | ICD-10-CM

## 2018-12-01 NOTE — Telephone Encounter (Signed)
Noct o2 order sent

## 2018-12-01 NOTE — Telephone Encounter (Signed)
-----   Message from [place], MD sent at 12/01/2018 10:56 AM EST ----- Regarding: Nocturnal oxygen Please arrange for nocturnal oxygen of 2L for patient to wear at night. Results reviewed and documented in chart.  JE

## 2018-12-04 MED ORDER — POTASSIUM CHLORIDE CRYS ER 20 MEQ PO TBCR: EXTENDED_RELEASE_TABLET | ORAL | 3 refills | Status: AC

## 2018-12-04 MED ORDER — TORSEMIDE 20 MG PO TABS: ORAL_TABLET | ORAL | 3 refills | Status: DC

## 2018-12-06 ENCOUNTER — Telehealth: Payer: Self-pay | Admitting: Pulmonary Disease

## 2018-12-06 DIAGNOSIS — R0681 Apnea, not elsewhere classified: Secondary | ICD-10-CM

## 2018-12-06 NOTE — Telephone Encounter (Signed)
Attempted to call [name] with [employer] but unable to reach. Left message for her to return call. 

## 2018-12-06 NOTE — Telephone Encounter (Signed)
Wesley Baker calling back [phone]

## 2018-12-06 NOTE — Telephone Encounter (Signed)
Spoke with Wesley Baker  The ONO was not resulted in time and needs to be repeated for ins to cover o2  It has to be repeated and resulted by 12/15/18  Will re-order the ONO on RA  I ATC and explain to the pt but there was NA and no option to leave a msg

## 2018-12-08 NOTE — Telephone Encounter (Signed)
I attempted to call pt but there was no answer and no VM to leave message.

## 2018-12-11 NOTE — Telephone Encounter (Signed)
Call made to patient, aware he will need to re-do his ONO. Order has already been placed. Voiced understanding.   Nothing further needed at this time.

## 2018-12-12 ENCOUNTER — Ambulatory Visit: Payer: Medicare HMO

## 2018-12-12 ENCOUNTER — Other Ambulatory Visit: Payer: Self-pay

## 2018-12-12 DIAGNOSIS — R0681 Apnea, not elsewhere classified: Secondary | ICD-10-CM

## 2018-12-12 DIAGNOSIS — G4733 Obstructive sleep apnea (adult) (pediatric): Secondary | ICD-10-CM

## 2018-12-13 ENCOUNTER — Telehealth: Payer: Self-pay | Admitting: *Deleted

## 2018-12-13 NOTE — Telephone Encounter (Signed)
-----   Message from Alfred, MD sent at 12/01/2018 10:56 AM EST ----- Regarding: Nocturnal oxygen Please arrange for nocturnal oxygen of 2L for patient to wear at night. Results reviewed and documented in chart.  JE

## 2018-12-14 NOTE — Telephone Encounter (Signed)
Patient son is returning the call. CB is [phone]

## 2018-12-14 NOTE — Telephone Encounter (Signed)
ATC number listed below. Line rang several times before going silent. ATC pt at other line but rang busy. Per pt's chart I do not see a DPR on file to release information to pt's son.

## 2018-12-14 NOTE — Progress Notes (Signed)
At to call patient unable to reach unable to leave message Orders not placed yet

## 2018-12-14 NOTE — Telephone Encounter (Signed)
Patient's son is his caregiver and has verbally given permission for son to receive updates and phone calls with me when discussing his care in the office with me. Not sure why DPR is not updated. Please reach out to son to release info. Contact me as needed if you run into any issues.  Rodman Pickle, M.D. [facility] Pulmonary/Critical Care Medicine 12/14/2018 7:04 PM

## 2018-12-15 NOTE — Telephone Encounter (Signed)
See 11/11 phone note- this has already been handled.  Will close encounter.

## 2018-12-19 DIAGNOSIS — G4733 Obstructive sleep apnea (adult) (pediatric): Secondary | ICD-10-CM

## 2018-12-20 ENCOUNTER — Encounter: Payer: Self-pay | Admitting: Pulmonary Disease

## 2018-12-20 ENCOUNTER — Telehealth: Payer: Self-pay

## 2018-12-20 DIAGNOSIS — R0902 Hypoxemia: Secondary | ICD-10-CM | POA: Diagnosis not present

## 2018-12-20 DIAGNOSIS — J449 Chronic obstructive pulmonary disease, unspecified: Secondary | ICD-10-CM | POA: Diagnosis not present

## 2018-12-20 NOTE — Telephone Encounter (Signed)
Call made to patient, verbal given to speak with son [name], made of HST results:  AHI 30.7, severe OSA  Recommended cpap auto titrating 5-20cm.   Per AO evaluate and ascertain that patient will want to use cpcp, this is to avoid intiating treatment that may take adjusting to this in this elderly pt.   Per son he does not think his farther will use to cpap machine, however he would rather have a visit to discuss in more detail.   Appt made. Nothing further needed at this time.

## 2018-12-31 NOTE — Progress Notes (Signed)
Virtual Visit via Video Note  I connected with Wesley Baker on 01/01/19 at 11:30 AM EST by a video enabled telemedicine application and verified that I am speaking with the correct person using two identifiers.  Location: Patient: Home Provider: Office - [facility] - [id number], [address], [place], [place] [address]  I discussed the limitations of evaluation and management by telemedicine and the availability of in person appointments. The patient expressed understanding and agreed to proceed. I also discussed with the patient that there may be a patient responsible charge related to this service. The patient expressed understanding and agreed to proceed.  Patient consented to consult via telephone: Yes People present and their role in pt care: Pt   History of Present Illness:  85 year old male former smoker followed in our office for COPD  PMH: Gout Smoker/ Smoking History: Former smoker.  96-pack-year smoking history Maintenance: Symbicort 160 and Incruse Ellipta  Pt of: Dr. Loanne Drilling  Chief complaint: HST results   85 year old male former smoker followed in our office for COPD.  At last office visit he was restarted on Symbicort 160 as well as Incruse Ellipta.  Patient's son feels that this has been helping as far as with his breathing.  Patient shortness of breath is likely multifactorial as he has known COPD, emphysema, A. fib, severe tricuspid regurgitation.  Patient's son is patient's caregiver and provides majority of patient's history as well as interaction on video visit today.  Patient son reporting the patient continues to have baseline dyspnea and he has started to wheeze more over the last 2 days.  He does have a productive cough with yellow-tinged mucus.  Mucus production is at baseline.  Patient son reports that patient continues to have increased daytime somnolence.  Patient's last CO2 level a month ago was 33 on a bmet.  Patient recently obtained a home sleep  study that showed severe obstructive sleep apnea with an AHI of 30.7. Dr. Ander Slade recommended starting APAP 5-20.  Per patient's son somnolence continues to be an ongoing issue.  Patient on Thanksgiving day was very sleepy mildly responsive and was difficult to wake up.  Patient's son reports that this has improved intermittently but continues to wax and wane.  Oxygen levels are stable.  Oxygen levels today are 94%.  Physical exam today due to being a video visit shows the patient is mildly responsive, very sleepy and somnolent.  He is able to follow commands when son speaks to him.  Patient is also hard of hearing.  Oxygen levels today are 94%.  Observations/Objective:  Physical Exam: Video  Very sleepy  Mildly responsive  somnolent  Follows commands  SPO2 - RA - 94  11/25/2017- CT chest [facility] health- multiple similar masslike nodular and bandlike foci of consolidation throughout lower lungs bilaterally appearance is most suggestive of chronic process such as multilobar lipoid pneumonia, chronic infection and neoplasm are considered less likely but not entirely excluded, evaluate for Vicks VapoRub or mineral oil-containing substance, follow-up chest CT in 3 months, moderate emphysema with diffuse bronchial wall thickening suggesting COPD, small dependent bilateral pleural effusions, dilated main pulmonary artery suggesting pulmonary arterial hypertension  02/03/2018-CT chest without contrast-resolution of bilateral lower lobe lung consolidation since 11/25/2017, compatible with resolved inflammatory or infectious process, decreased bilateral pleural effusions now traced, emphysema, CAD  02/01/2018-pulmonary function test- FVC 1.98 (55% predicted), postbronchodilator ratio 62, postbronchodilator FEV1 1.38 (56% predicted), mid flow reversibility after bronchodilator, DLCO 38  02/18/17 -chest x-ray in urgent care-  mild cardiomegaly with subtle right perihilar densities that may reflect asymmetrical  edema or mild pneumonia  08/03/2018-chest x-ray- enlargement of cardiac silhouette, bibasilar atelectasis and small left pleural effusion    Social History   Tobacco Use  Smoking Status Former Smoker  . Packs/day: 2.00  . Years: 48.00  . Pack years: 96.00  . Types: Cigarettes  . Quit date: 12/09/1991  . Years since quitting: 27.0  Smokeless Tobacco Never Used   Immunization History  Administered Date(s) Administered  . Fluad Quad(high Dose 65+) 11/02/2018  . Influenza Inj Mdck Quad Pf 11/07/2017  . Influenza-Unspecified 10/25/2013  . Pneumococcal Polysaccharide-23 11/12/2010    Assessment and Plan:  GOLD COPD II B Plan: Continue Symbicort 160 Continue Incruse Ellipta ABG order placed 2-week follow-up with our office  OSA (obstructive sleep apnea) Plan: Start CPAP therapy Order ABG May need to consider switching to NIV or BiPAP based off of ABG results 32-month follow-up needed with sleep doctor- Dr. Ander Slade  Excessive somnolence disorder Increased somnolence on visit today Increase somnolence per patient's son who is parent [facility] caregiver  Plan: We will order ABG  Follow Up Instructions:  Return in about 2 weeks (around 01/15/2019), or if symptoms worsen or fail to improve, for Follow up with Dr. Loanne Drilling, Follow up with [name] FNP-C.    I discussed the assessment and treatment plan with the patient. The patient was provided an opportunity to ask questions and all were answered. The patient agreed with the plan and demonstrated an understanding of the instructions.   The patient was advised to call back or seek an in-person evaluation if the symptoms worsen or if the condition fails to improve as anticipated.  I provided 26 minutes of non-face-to-face time during this encounter.   Lauraine Rinne, NP

## 2019-01-01 ENCOUNTER — Telehealth (INDEPENDENT_AMBULATORY_CARE_PROVIDER_SITE_OTHER): Payer: Medicare HMO | Admitting: Pulmonary Disease

## 2019-01-01 ENCOUNTER — Encounter: Payer: Self-pay | Admitting: Pulmonary Disease

## 2019-01-01 DIAGNOSIS — J42 Unspecified chronic bronchitis: Secondary | ICD-10-CM | POA: Diagnosis not present

## 2019-01-01 DIAGNOSIS — G4733 Obstructive sleep apnea (adult) (pediatric): Secondary | ICD-10-CM | POA: Insufficient documentation

## 2019-01-01 DIAGNOSIS — G4710 Hypersomnia, unspecified: Secondary | ICD-10-CM | POA: Diagnosis not present

## 2019-01-01 NOTE — Assessment & Plan Note (Deleted)
Plan: Continue Eliquis 

## 2019-01-01 NOTE — Assessment & Plan Note (Signed)
Increased somnolence on visit today Increase somnolence per patient's son who is parent [facility] caregiver  Plan: We will order ABG

## 2019-01-01 NOTE — Assessment & Plan Note (Addendum)
Plan: Start CPAP therapy Order ABG May need to consider switching to NIV or BiPAP based off of ABG results 18-month follow-up needed with sleep doctor- Dr. Ander Slade

## 2019-01-01 NOTE — Progress Notes (Signed)
Cardiology Office Note:   Date:  01/02/2019  NAME:  Wesley Baker    MRN: [id number] DOB:  [birth year]   PCP:  Maryella Shivers, MD  Cardiologist:  Evalina Field, MD   Referring MD: Maryella Shivers, MD   Chief Complaint  Patient presents with   Tricuspid Regurgitation   History of Present Illness:   Wesley Baker is a 85 y.o. male with a hx of COPD, Afib, HTN, severe TR, who presents for follow-up. Seen in September for severe TR with RHF symptoms. Due to age, family decided for mainly supportive measures.  He presents with his son for routine follow-up.  Since our last visit he is down nearly 15 pounds.  They have really modified her diet to reduce salt intake.  The torsemide dose is down to 20 mg as needed with potassium supplementation.  His lower extremities much improved.  He is still working with pulmonary to optimize his COPD.  He is still short of breath with minimal activity.  The son reports an episode of confusion and delirium and pulmonary has plans for an ABG to evaluate for CO2 retention.  His volume status is quite acceptable on examination today.  Much much better since our last visit.  He is tolerating Eliquis well and only had interrupt his therapy once for a bleeding leg.  He is back on it without any major issues.  Blood pressure looks great today.  Heart rate is well controlled.   Past Medical History: Past Medical History:  Diagnosis Date   COPD (chronic obstructive pulmonary disease) ([place])    Dysrhythmia    Gout    Hypertension    Pneumonia    recent and recurring over severalyears   Sleep apnea     Past Surgical History: Past Surgical History:  Procedure Laterality Date   BACK SURGERY     HIP ARTHROPLASTY Right 03/10/2017   Procedure: ARTHROPLASTY BIPOLAR HIP (HEMIARTHROPLASTY);  Surgeon: Renette Butters, MD;  Location: [facility];  Service: Orthopedics;  Laterality: Right;   KNEE SURGERY     PROSTATE SURGERY      Current  Medications: Current Meds  Medication Sig   albuterol (VENTOLIN HFA) 108 (90 Base) MCG/ACT inhaler Inhale 2 puffs into the lungs every 4 (four) hours as needed for wheezing or shortness of breath.   allopurinol (ZYLOPRIM) 300 MG tablet Take 300 mg by mouth daily.   apixaban (ELIQUIS) 5 MG TABS tablet Take 1 tablet (5 mg total) by mouth 2 (two) times daily.   budesonide-formoterol (SYMBICORT) 160-4.5 MCG/ACT inhaler Inhale 2 puffs into the lungs 2 (two) times daily.   gabapentin (NEURONTIN) 600 MG tablet Take 600 mg by mouth 3 (three) times daily.   ipratropium (ATROVENT) 0.06 % nasal spray Place 1 spray into the nose 2 (two) times daily.   ipratropium-albuterol (DUONEB) 0.5-2.5 (3) MG/3ML SOLN Inhale 3 mLs into the lungs 3 (three) times daily. Takes once in the evening   levofloxacin (LEVAQUIN) 750 MG tablet Take 1 tablet (750 mg total) by mouth daily.   metoprolol succinate (TOPROL-XL) 100 MG 24 hr tablet Take 50 mg by mouth daily. Only takes 50 mg currently   potassium chloride SA (KLOR-CON) 20 MEQ tablet Take 1/2 tablet (53mEq) by mouth only with torsemide.   torsemide (DEMADEX) 20 MG tablet Take 20mg  daily as needed. Take 42mEq potassium on days torsemide is taken.   umeclidinium bromide (INCRUSE ELLIPTA) 62.5 MCG/INH AEPB Inhale 1 puff into the lungs daily.  Allergies:    Patient has no known allergies.   Social History: Social History   Socioeconomic History   Marital status: Single    Spouse name: Not on file   Number of children: Not on file   Years of education: Not on file   Highest education level: Not on file  Occupational History   Occupation: retired     Comment: lives with his son, [name]   Social Needs   Emergency planning/management officer strain: Not hard at all   Food insecurity    Worry: Never true    Inability: Never true   Transportation needs    Medical: No    Non-medical: No  Tobacco Use   Smoking status: Former Smoker    Packs/day: 2.00     Years: 48.00    Pack years: 96.00    Types: Cigarettes    Quit date: 12/09/1991    Years since quitting: 27.0   Smokeless tobacco: Never Used  Substance and Sexual Activity   Alcohol use: No    Frequency: Never   Drug use: No   Sexual activity: Not on file  Lifestyle   Physical activity    Days per week: Not on file    Minutes per session: Not on file   Stress: Not on file  Relationships   Social connections    Talks on phone: Not on file    Gets together: Not on file    Attends religious service: Not on file    Active member of club or organization: Not on file    Attends meetings of clubs or organizations: Not on file    Relationship status: Not on file  Other Topics Concern   Not on file  Social History Narrative   Lives at home with son that provides 72 support      Family History: The patient's family history includes Diabetes in his son; Diabetes Mellitus II in his son; Heart disease in his brother.  ROS:   All other ROS reviewed and negative. Pertinent positives noted in the HPI.     EKGs/Labs/Other Studies Reviewed:   The following studies were personally reviewed by me today:  Echo 09/27/2018 1. The left ventricle has normal systolic function, with an ejection fraction of 55-60%. The cavity size was normal. There is mildly increased left ventricular wall thickness. Left ventricular diastolic function could not be evaluated secondary to  atrial fibrillation. There is right ventricular volume overload. 2. The mitral valve is degenerative. Moderate thickening of the mitral valve leaflet. Mild calcification of the mitral valve leaflet. There is moderate mitral annular calcification present. Mitral valve regurgitation is mild to moderate by color flow  Doppler. The MR jet is posteriorly-directed. 3. There is mild to moderate MR which is eccentric and directed posteriorly. The PMVL appears calcified and restricted. The degree of MR likely is underestimated due  to poor interrogation. Due to the size of the LA and atrial fibrillation, atrial  functional MR could be the etiology of the impressive LA. This also could results in similar pathology for the TV apparatus (severe TR and huge RA!). 4. Left atrial size was severely dilated. 5. Right atrial size was severely dilated. 6. Trivial pericardial effusion is present. 7. The tricuspid valve is grossly normal. Tricuspid valve regurgitation is severe. 8. There is severe TR with systolic flow reversal in the hepatic veins. 9. The aortic valve is tricuspid. Mild thickening of the aortic valve. Mild calcification of the aortic valve. No stenosis of  the aortic valve. 10. The aorta is normal unless otherwise noted. 11. The aortic root and ascending aorta are normal in size and structure. 12. There is evidence of plaque in the aortic root. 13. The right ventricle has mildly reduced systolic function. The cavity was moderately enlarged. There is no increase in right ventricular wall thickness. Right ventricular systolic pressure is moderately elevated with an estimated pressure of 40.6  mmHg. 14. RVSP is severely underestimated due to severe TR. 15. The inferior vena cava was dilated in size with <50% respiratory variability. 16. When compared to the prior study: No prior study.  Recent Labs: 10/04/2018: ALT 16 11/14/2018: BUN 13; Creatinine, Ser 0.95; Potassium 4.0; Sodium 134   Recent Lipid Panel No results found for: CHOL, TRIG, HDL, CHOLHDL, VLDL, LDLCALC, LDLDIRECT  Physical Exam:   VS:  BP 132/64    Pulse 69    Ht 5\' 8"  (1.727 m)    Wt 186 lb (84.4 kg)    BMI 28.28 kg/m    Wt Readings from Last 3 Encounters:  01/02/19 186 lb (84.4 kg)  11/17/18 182 lb (82.6 kg)  11/14/18 185 lb 9.6 oz (84.2 kg)    General: Well nourished, well developed, in no acute distress Heart: Atraumatic, normal size  Eyes: PEERLA, EOMI  Neck: Supple, no JVD Endocrine: No thryomegaly Cardiac: Irregular rhythm, faint  systolic murmur Lungs: Diffuse rhonchi bilaterally Abd: Soft, nontender, no hepatomegaly  Ext: 1+ lower extremity edema, lower extremities and compression stockings Musculoskeletal: No deformities, BUE and BLE strength normal and equal Skin: Warm and dry, no rashes   Neuro: Alert, awake, oriented to person only Psych: Normal mood and affect   ASSESSMENT:   Wesley Baker is a 85 y.o. male who presents for the following: 1. Permanent atrial fibrillation ([place])   2. Nonrheumatic mitral valve regurgitation   3. Severe tricuspid regurgitation   4. Chronic right-sided heart failure (HCC)     PLAN:   1. Permanent atrial fibrillation (HCC) -Rate controlled metoprolol.  Continue Eliquis.  No bleeding issues.  We will continue with rate control strategy in this 85 year old gentleman.  2. Nonrheumatic mitral valve regurgitation 3. Severe tricuspid regurgitation 4. Chronic right-sided heart failure (HCC) -He has severe tricuspid regurgitation a moderate mitral regurgitation.  I suspect this is all related to his atrial fibrillation which is known to cause atrial functional mitral vegetation and also tricuspid annular dilatation.  His tricuspid regurgitation is possibly also related to his COPD which is severe.  I had a lengthy discussion with he and his son at our last visit and given his advanced age some memory issues we will just pursue conservative therapy at this time.  His volume status is much improved since her last visit and I am very enthused about how well they are taking care of him.  His lower extreme edema is almost nonexistent compared to our last visit.  We will check a BMP today to make sure kidney function is stable.  We will continue with her plan of 20 mg of torsemide and 10 mEq of potassium as needed for weight gain.  His son takes exceptional care of him checking his weights daily and gives him the Lasix if there is any gain weight.  I will plan to touch base with him in 3 months  by a virtual visit.  Disposition: Return in about 3 months (around 04/02/2019).  Medication Adjustments/Labs and Tests Ordered: Current medicines are reviewed at length with the patient today.  Concerns regarding medicines are outlined above.  Orders Placed This Encounter  Procedures   Basic metabolic panel   No orders of the defined types were placed in this encounter.   Patient Instructions  Medication Instructions:  NO CHANGE *If you need a refill on your cardiac medications before your next appointment, please call your pharmacy*  Lab Work: Your physician recommends that you HAVE LAB WORK TODAY If you have labs (blood work) drawn today and your tests are completely normal, you will receive your results only by:  [place] (if you have MyChart) OR  A paper copy in the mail If you have any lab test that is abnormal or we need to change your treatment, we will call you to review the results.  Follow-Up: At [facility], you and your health needs are our priority.  As part of our continuing mission to provide you with exceptional heart care, we have created designated Provider Care Teams.  These Care Teams include your primary Cardiologist (physician) and Advanced Practice Providers (APPs -  Physician Assistants and Nurse Practitioners) who all work together to provide you with the care you need, when you need it.  Your next appointment:   3 month(s)  The format for your next appointment:   Virtual Visit   Provider:   Eleonore Chiquito, MD      Signed, Addison Naegeli. Audie Box, [place]  [address], [place], [place] [address] [phone]  01/02/2019 3:00 PM

## 2019-01-01 NOTE — Progress Notes (Signed)
Discussed visit with Warner Mccreedy, NP. Agree with ABG to rule out hypercapnia. If elevated, may need to consider in-lab sleep PAP titration. Will obtain CPAP for now until this data becomes available.  Rodman Pickle, M.D. [facility] Pulmonary/Critical Care Medicine 01/01/2019 2:45 PM

## 2019-01-01 NOTE — Patient Instructions (Addendum)
You were seen today by Lauraine Rinne, NP  for:   1. Chronic bronchitis, unspecified chronic bronchitis type ([place])  - Pulmonary function test; Future  Continue Symbicort 160 >>> 2 puffs in the morning right when you wake up, rinse out your mouth after use, 12 hours later 2 puffs, rinse after use >>> Take this daily, no matter what >>> This is not a rescue inhaler   Incruse Ellipta  >>> Take 1 puff daily in the morning right when you wake up >>>Rinse your mouth out after use >>>This is a daily maintenance inhaler, NOT a rescue inhaler >>>Contact our office if you are having difficulties affording or obtaining this medication >>>It is important for you to be able to take this daily and not miss any doses   Note your daily symptoms > remember "red flags" for COPD:   >>>Increase in cough >>>increase in sputum production >>>increase in shortness of breath or activity  intolerance.   If you notice these symptoms, please call the office to be seen.   2. Excessive somnolence disorder  We will obtain an ABG to further evaluate increased somnolence  3. OSA (obstructive sleep apnea)  New CPAP Order: DME: [facility]  APAP 5-20 New CPAP start Supplies Mask of choice  Appointment needs to be scheduled with Dr. Ander Slade in 8 weeks with 18min OV  We recommend that you start using your CPAP daily >>>Keep up the hard work using your device >>> Goal should be wearing this for the entire night that you are sleeping, at least 4 to 6 hours  Remember:  . Do not drive or operate heavy machinery if tired or drowsy.  . Please notify the supply company and office if you are unable to use your device regularly due to missing supplies or machine being broken.  . Work on maintaining a healthy weight and following your recommended nutrition plan  . Maintain proper daily exercise and movement  . Maintaining proper use of your device can also help improve management of other chronic illnesses such as:  Blood pressure, blood sugars, and weight management.   BiPAP/ CPAP Cleaning:  >>>Clean weekly, with Dawn soap, and bottle brush.  Set up to air dry. >>> Wipe mask out daily with wet wipe or towelette    We recommend today:  Orders Placed This Encounter  Procedures  . Pulmonary function test    Standing Status:   Future    Standing Expiration Date:   01/01/2020    Scheduling Instructions:     Per Aaron Edelman, he would like for the patient to have a ABG something within the next few days.    Order Specific Question:   Where should this test be performed?    Answer:   [facility]    Order Specific Question:   MIP/MEP    Answer:   No    Order Specific Question:   6 minute walk    Answer:   No    Order Specific Question:   ABG    Answer:   Yes    Order Specific Question:   Diffusion capacity (DLCO)    Answer:   No    Order Specific Question:   Lung volumes    Answer:   No    Order Specific Question:   Methacholine challenge    Answer:   No   Orders Placed This Encounter  Procedures  . Pulmonary function test   No orders of the defined types were placed in  this encounter.   Follow Up:    Return in about 2 weeks (around 01/15/2019), or if symptoms worsen or fail to improve, for Follow up with Dr. Loanne Drilling, Follow up with [name] FNP-C.   2 month appt with Dr. Ander Slade    Please do your part to reduce the spread of COVID-19:      Reduce your risk of any infection  and COVID19 by using the similar precautions used for avoiding the common cold or flu:  Marland Kitchen Wash your hands often with soap and warm water for at least 20 seconds.  If soap and water are not readily available, use an alcohol-based hand sanitizer with at least 60% alcohol.  . If coughing or sneezing, cover your mouth and nose by coughing or sneezing into the elbow areas of your shirt or coat, into a tissue or into your sleeve (not your hands). Langley Gauss A MASK when in public  . Avoid shaking hands with others and consider  head nods or verbal greetings only. . Avoid touching your eyes, nose, or mouth with unwashed hands.  . Avoid close contact with people who are sick. . Avoid places or events with large numbers of people in one location, like concerts or sporting events. . If you have some symptoms but not all symptoms, continue to monitor at home and seek medical attention if your symptoms worsen. . If you are having a medical emergency, call 911.   [place] / e-Visit: [email or web address]         [facility]: [facility]: [id number]                   [facility]: [id number]     It is flu season:   >>> Best ways to protect herself from the flu: Receive the yearly flu vaccine, practice good hand hygiene washing with soap and also using hand sanitizer when available, eat a nutritious meals, get adequate rest, hydrate appropriately   Please contact the office if your symptoms worsen or you have concerns that you are not improving.   Thank you for choosing [facility] for your healthcare, and for allowing [place] to partner with you on your healthcare journey. I am thankful to be able to provide care to you today.   [name] FNP-C

## 2019-01-01 NOTE — Assessment & Plan Note (Signed)
Plan: Continue Symbicort 160 Continue Incruse Ellipta ABG order placed 2-week follow-up with our office

## 2019-01-02 ENCOUNTER — Ambulatory Visit (INDEPENDENT_AMBULATORY_CARE_PROVIDER_SITE_OTHER): Payer: Medicare HMO | Admitting: Cardiovascular Disease

## 2019-01-02 ENCOUNTER — Ambulatory Visit (HOSPITAL_COMMUNITY)
Admission: RE | Admit: 2019-01-02 | Discharge: 2019-01-02 | Disposition: A | Discharge status: Home / Self Care | Payer: Medicare HMO | Source: Ambulatory Visit | Attending: Cardiovascular Disease | Admitting: Cardiovascular Disease

## 2019-01-02 ENCOUNTER — Encounter: Payer: Self-pay | Admitting: Cardiovascular Disease

## 2019-01-02 ENCOUNTER — Other Ambulatory Visit: Payer: Self-pay

## 2019-01-02 VITALS — BP 132/64 | HR 69 | Ht 68.0 in | Wt 186.0 lb

## 2019-01-02 DIAGNOSIS — Z7901 Long term (current) use of anticoagulants: Secondary | ICD-10-CM | POA: Insufficient documentation

## 2019-01-02 DIAGNOSIS — I50812 Chronic right heart failure: Secondary | ICD-10-CM | POA: Insufficient documentation

## 2019-01-02 DIAGNOSIS — I4821 Permanent atrial fibrillation: Secondary | ICD-10-CM | POA: Diagnosis not present

## 2019-01-02 DIAGNOSIS — I340 Nonrheumatic mitral (valve) insufficiency: Secondary | ICD-10-CM

## 2019-01-02 DIAGNOSIS — I071 Rheumatic tricuspid insufficiency: Secondary | ICD-10-CM

## 2019-01-02 DIAGNOSIS — Z79899 Other long term (current) drug therapy: Secondary | ICD-10-CM | POA: Diagnosis not present

## 2019-01-02 DIAGNOSIS — Z87891 Personal history of nicotine dependence: Secondary | ICD-10-CM | POA: Diagnosis not present

## 2019-01-02 DIAGNOSIS — J449 Chronic obstructive pulmonary disease, unspecified: Secondary | ICD-10-CM | POA: Diagnosis not present

## 2019-01-02 DIAGNOSIS — I110 Hypertensive heart disease with heart failure: Secondary | ICD-10-CM | POA: Diagnosis not present

## 2019-01-02 LAB — BLOOD GAS, ARTERIAL
Acid-Base Excess: 3.1 mmol/L — ABNORMAL HIGH (ref 0.0–2.0)
Bicarbonate: 27.1 mmol/L (ref 20.0–28.0)
Drawn by: 21179
FIO2: 21
O2 Saturation: 94.6 %
Patient temperature: 37
pCO2 arterial: 41.7 mmHg (ref 32.0–48.0)
pH, Arterial: 7.429 (ref 7.350–7.450)
pO2, Arterial: 72.1 mmHg — ABNORMAL LOW (ref 83.0–108.0)

## 2019-01-02 NOTE — Patient Instructions (Signed)
Medication Instructions:  NO CHANGE *If you need a refill on your cardiac medications before your next appointment, please call your pharmacy*  Lab Work: Your physician recommends that you HAVE LAB WORK TODAY If you have labs (blood work) drawn today and your tests are completely normal, you will receive your results only by: Mar[place] Kitchen MyChart Message (if you have MyChart) OR . A paper copy in the mail If you have any lab test that is abnormal or we need to change your treatment, we will call you to review the results.  Follow-Up: At [facility], you and your health needs are our priority.  As part of our continuing mission to provide you with exceptional heart care, we have created designated Provider Care Teams.  These Care Teams include your primary Cardiologist (physician) and Advanced Practice Providers (APPs -  Physician Assistants and Nurse Practitioners) who all work together to provide you with the care you need, when you need it.  Your next appointment:   3 month(s)  The format for your next appointment:   Virtual Visit   Provider:   Eleonore Chiquito, MD

## 2019-01-03 DIAGNOSIS — E871 Hypo-osmolality and hyponatremia: Secondary | ICD-10-CM | POA: Diagnosis not present

## 2019-01-03 LAB — BASIC METABOLIC PANEL
BUN/Creatinine Ratio: 16 (ref 10–24)
BUN: 18 mg/dL (ref 10–36)
CO2: 27 mmol/L (ref 20–29)
Calcium: 9.1 mg/dL (ref 8.6–10.2)
Chloride: 99 mmol/L (ref 96–106)
Creatinine, Ser: 1.12 mg/dL (ref 0.76–1.27)
GFR calc Af Amer: 66 mL/min/{1.73_m2} (ref >59)
GFR calc non Af Amer: 57 mL/min/{1.73_m2} — ABNORMAL LOW (ref >59)
Glucose: 71 mg/dL (ref 65–99)
Potassium: 4.7 mmol/L (ref 3.5–5.2)
Sodium: 139 mmol/L (ref 134–144)

## 2019-01-09 ENCOUNTER — Telehealth: Payer: Self-pay | Admitting: Pulmonary Disease

## 2019-01-09 ENCOUNTER — Encounter: Payer: Self-pay | Admitting: Pulmonary Disease

## 2019-01-09 ENCOUNTER — Telehealth: Payer: Self-pay | Admitting: Cardiovascular Disease

## 2019-01-09 DIAGNOSIS — G4734 Idiopathic sleep related nonobstructive alveolar hypoventilation: Secondary | ICD-10-CM

## 2019-01-09 NOTE — Progress Notes (Signed)
Called and spoke to pt's son. Informed him of the results and recs per JE. Order placed for O2. Pt's son verbalized understanding and denied any further questions or concerns at this time.

## 2019-01-09 NOTE — Telephone Encounter (Signed)
pccs please advise if sleep study can be faxed to [facility] as requested.  Thanks!

## 2019-01-09 NOTE — Telephone Encounter (Signed)
[facility] has access to the sleep study in [place].  However, I have faxed it to [phone] as requested.  Rec'd fax confirm.

## 2019-01-09 NOTE — Telephone Encounter (Signed)
[facility] pharmacy has already been added.

## 2019-01-09 NOTE — Progress Notes (Signed)
Overnight Oximetry Report  Reviewed report from 12/20/18. Patient with SpO2 <88% for 1hour 41 min 36 sec. [place] O2 74%. Patient qualifies for nocturnal oxygen.  Will arrange for 2L O2 via [place] while sleeping.  Rodman Pickle, M.D. [facility] Pulmonary/Critical Care Medicine 01/09/2019 1:13 PM

## 2019-01-09 NOTE — Telephone Encounter (Signed)
Son called and wanted to let the office know that a fax will be coming from [place]. The son is switching all of the patient's prescriptions to mail order to reduce trips to the Pharmacy.  [facility] may be requesting new Rx, but the son is not sure.

## 2019-01-10 ENCOUNTER — Telehealth: Payer: Self-pay | Admitting: Pulmonary Disease

## 2019-01-10 DIAGNOSIS — J209 Acute bronchitis, unspecified: Secondary | ICD-10-CM | POA: Diagnosis not present

## 2019-01-10 DIAGNOSIS — I482 Chronic atrial fibrillation, unspecified: Secondary | ICD-10-CM | POA: Diagnosis not present

## 2019-01-10 DIAGNOSIS — G4734 Idiopathic sleep related nonobstructive alveolar hypoventilation: Secondary | ICD-10-CM

## 2019-01-10 DIAGNOSIS — I509 Heart failure, unspecified: Secondary | ICD-10-CM | POA: Diagnosis not present

## 2019-01-10 DIAGNOSIS — J440 Chronic obstructive pulmonary disease with acute lower respiratory infection: Secondary | ICD-10-CM | POA: Diagnosis not present

## 2019-01-10 NOTE — Telephone Encounter (Signed)
New order placed- nocturnal o2 based on ONO

## 2019-01-11 ENCOUNTER — Other Ambulatory Visit: Payer: Self-pay

## 2019-01-11 MED ORDER — TORSEMIDE 20 MG PO TABS: ORAL_TABLET | ORAL | 3 refills | Status: DC

## 2019-01-11 MED ORDER — APIXABAN 5 MG PO TABS: 5.0000 mg | ORAL_TABLET | Freq: Two times a day (BID) | ORAL | 6 refills | Status: DC

## 2019-01-12 ENCOUNTER — Telehealth: Payer: Self-pay | Admitting: Pulmonary Disease

## 2019-01-12 ENCOUNTER — Other Ambulatory Visit: Payer: Self-pay

## 2019-01-12 MED ORDER — BUDESONIDE-FORMOTEROL FUMARATE 160-4.5 MCG/ACT IN AERO: 2.0000 | INHALATION_SPRAY | Freq: Two times a day (BID) | RESPIRATORY_TRACT | 1 refills | Status: AC

## 2019-01-12 NOTE — Telephone Encounter (Signed)
Spoke with [name], She states the patient had a sleep study and was diagnosed with OSA. Before the sleep study pt had an order for oxygen because his ONO showed desaturations at night. Now Lenna Sciara is wanting to know if JE wanted to order a CPAP titration to see how much oxygen he needs or if she wanted to treat him on CPAP and then do another ONO on CPAP to see if he still needs oxygen at night. JE please advise. [name] is going to preocess the CPAP order and hold the oxygen order for now.

## 2019-01-14 NOTE — Telephone Encounter (Signed)
Pulmonary team has discussed CPAP with patient and family and he is unlikely to tolerate CPAP. Please order oxygen ASAP so that we do not fall out of the face-to-face time window.  Rodman Pickle, M.D. [facility] Pulmonary/Critical Care Medicine 01/14/2019 1:19 PM

## 2019-01-15 ENCOUNTER — Ambulatory Visit: Payer: Medicare HMO | Admitting: Pulmonary Disease

## 2019-01-15 ENCOUNTER — Other Ambulatory Visit: Payer: Self-pay

## 2019-01-15 ENCOUNTER — Telehealth: Payer: Self-pay | Admitting: Pulmonary Disease

## 2019-01-15 ENCOUNTER — Encounter: Payer: Self-pay | Admitting: Pulmonary Disease

## 2019-01-15 VITALS — BP 116/58 | HR 65 | Temp 97.7°F | Ht 68.0 in | Wt 178.0 lb

## 2019-01-15 DIAGNOSIS — G4733 Obstructive sleep apnea (adult) (pediatric): Secondary | ICD-10-CM | POA: Diagnosis not present

## 2019-01-15 DIAGNOSIS — Z7189 Other specified counseling: Secondary | ICD-10-CM | POA: Diagnosis not present

## 2019-01-15 DIAGNOSIS — J449 Chronic obstructive pulmonary disease, unspecified: Secondary | ICD-10-CM | POA: Diagnosis not present

## 2019-01-15 DIAGNOSIS — G4734 Idiopathic sleep related nonobstructive alveolar hypoventilation: Secondary | ICD-10-CM | POA: Diagnosis not present

## 2019-01-15 MED ORDER — APIXABAN 5 MG PO TABS: 5.0000 mg | ORAL_TABLET | Freq: Two times a day (BID) | ORAL | 6 refills | Status: DC

## 2019-01-15 MED ORDER — TORSEMIDE 20 MG PO TABS: ORAL_TABLET | ORAL | 3 refills | Status: AC

## 2019-01-15 NOTE — Telephone Encounter (Signed)
Spoke with Wesley Baker, she was calling us to let us know that the pt is not eligible for Hospice at this time. They can do palliative care but they will need another order to be sent to the fax number listed below. Dr. Loanne Drilling can we send an order to [facility]? Please advise.    [phone]

## 2019-01-15 NOTE — Telephone Encounter (Signed)
Wesley Baker did you receive a response from [place]?

## 2019-01-15 NOTE — Telephone Encounter (Signed)
Received a message from [place]. She is going to contact pt and family in regards to the oxygen. Nothing further needed.

## 2019-01-15 NOTE — Telephone Encounter (Signed)
Please contact [name] back with the following update. After today's visit, patient is being referred to Hospice. To minimize discomfort for a patient with nocturnal hypoxemia who will unlikely tolerate CPAP, I would like to order oxygen. We are happy to continue discussion with obtaining CPAP with the family but in the interim, I believe prescribing oxygen for a patient for compassionate use would be appropriate. Please let me know if I need to be directly involved.  Rodman Pickle, M.D. [facility] Pulmonary/Critical Care Medicine 01/15/2019 12:25 PM

## 2019-01-15 NOTE — Telephone Encounter (Signed)
Just checked and no response yet. I will keep checking and will update once I receive the response.

## 2019-01-15 NOTE — Progress Notes (Signed)
Subjective:   PATIENT ID: Wesley Baker GENDER: male DOB: [birth year], MRN: [id number]   HPI  Chief Complaint  Patient presents with  . Follow-up    pt's son states pt has increased wheezing, sob with any exertion, fatigue with exertion, sometimes prod cough with clear mucus.     [name] is a 85 year old male former smoker with COPD, severe tricuspid regurgitation, atrial fibrillation and hypertension who presents for follow-up.  His son is his primary caregiver and presents history.  Patient continues have witnessed apneic episodes at home that result in confusion and inattentiveness during the day. We have attempted to obtain oxygen however due to his available sleep test results this has resulted in delay of getting oxygen or CPAP. His mental state is distressing to his son and he expresses concern about how to care for his father if his health continues to decline. Patient does not wish to be hospitalized in the future and expresses that he would prefer to pass away at home with family if something were to happen. Patient remains sedentary at baseline and with have dyspnea on exertion with transfers and/or any activity. Compliant with ome inhalers and diuretics. Denies chest pain, wheezing, cough or lower extremity edema.  Social History: 96-pack-year history.  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  Outpatient Medications Prior to Visit  Medication Sig Dispense Refill  . albuterol (VENTOLIN HFA) 108 (90 Base) MCG/ACT inhaler Inhale 2 puffs into the lungs every 4 (four) hours as needed for wheezing or shortness of breath. 18 g 3  . allopurinol (ZYLOPRIM) 300 MG tablet Take 300 mg by mouth daily.    Marland Kitchen apixaban (ELIQUIS) 5 MG TABS tablet Take 1 tablet (5 mg  total) by mouth 2 (two) times daily. 60 tablet 6  . budesonide-formoterol (SYMBICORT) 160-4.5 MCG/ACT inhaler Inhale 2 puffs into the lungs 2 (two) times daily. 3 Inhaler 1  . gabapentin (NEURONTIN) 600 MG tablet Take 600 mg by mouth 3 (three) times daily.    Marland Kitchen ipratropium (ATROVENT) 0.06 % nasal spray Place 1 spray into the nose 2 (two) times daily.    Marland Kitchen ipratropium-albuterol (DUONEB) 0.5-2.5 (3) MG/3ML SOLN Inhale 3 mLs into the lungs 3 (three) times daily. Takes once in the evening    . metoprolol succinate (TOPROL-XL) 100 MG 24 hr tablet Take 50 mg by mouth daily. Only takes 50 mg currently    . potassium chloride SA (KLOR-CON) 20 MEQ tablet Take 1/2 tablet (95mEq) by mouth only with torsemide. 90 tablet 3  . torsemide (DEMADEX) 20 MG tablet Take 20mg  daily as needed. Take 43mEq potassium on days torsemide is taken. 180 tablet 3  . umeclidinium bromide (INCRUSE ELLIPTA) 62.5 MCG/INH AEPB Inhale 1 puff into the lungs daily. 3 each 3  . levofloxacin (LEVAQUIN) 750 MG tablet Take 1 tablet (750 mg total) by mouth daily. (Patient not taking: Reported on 01/15/2019) 7 tablet 0   No facility-administered medications prior to visit.   Review of Systems  Constitutional: Positive for malaise/fatigue. Negative for chills, diaphoresis, fever and weight loss.  HENT: Negative for congestion.   Respiratory: Positive for shortness of breath. Negative for cough, hemoptysis, sputum production and wheezing.   Cardiovascular: Negative for chest pain, palpitations and leg swelling.  Neurological:       Confusion  Objective:   Vitals:   01/15/19 1122  BP: (!) 116/58  Pulse: 65  Temp: 97.7 F (36.5 C)  TempSrc: Temporal  SpO2: 97%  Weight: 178 lb (80.7 kg)  Height: 5\' 8"  (1.727 m)   SpO2: 97 % O2 Device: None (Room air)  Physical Exam: General: Chronically ill-appearing, no acute distress HENT: [place], AT, bilaterally hearing loss Eyes: EOMI, no scleral icterus Respiratory: Decreased breath  sounds bilaterally. No crackles, wheezing or rales Cardiovascular: RRR, -M/R/G, no JVD Extremities:-Edema,-tenderness Neuro: Awake, alert, follows commands, moves extremities x 4  Data Reviewed:  Chest Imaging CT chest 02/03/2018-resolution of lower lobe airspace disease and effusions.  Unchanged 5 mm calcified lung nodule in the right upper lobe CT chest 11/25/2017-bilateral nodular and masslike consolidations predominantly in the lower lobes.  Emphysema  PFT 02/01/18 FVC 2.22 (62%) FEV1 1.38 (56%) Ratio 64 DLCO 38% Interpretation: Moderately severe obstructive defect with severely reduced DLCO suggestive of emphysema  Imaging, labs and test noted above have been reviewed independently by me.    Assessment & Plan:   Discussion: 85 year old male for his former smoker with COPD, severe tricuspid regurgitation, atrial fibrillation hypertension who presents for follow-up.  Compliant with current inhaler regimen.  Optimized from a pulmonary standpoint.  He was recently diagnosed with OSA and due to concern for patient's ability to tolerate CPAP, will order nocturnal oxygen.   Moderately severe COPD CONTINUE Symbicort 160-4.5 mcg two puffs twice a day CONTINUE Incruse once daily AS NEEDED Albuterol Duonebs for shortness of breath or wheezing Refer to hospice for end-stage COPD  Nocturnal hypoxemia secondary to OSA START nocturnal oxygen Re-evaluate if patient will tolerate CPAP   DNR, however wishes for mechanical ventilation if needed Per prior discussion, patient would NOT want chest compressions or CPR. However would be willing to be on a ventilator if needed. Please continue to discuss this decision with your family.  Orders Placed This Encounter  Procedures  . Ambulatory referral to Hospice    Referral Priority:   Routine    Referral Type:   Consultation    Referral Reason:   Specialty Services Required    Requested Specialty:   Hospice Services    Number of Visits Requested:    1   No orders of the defined types were placed in this encounter.   Return in about 3 months (around 04/15/2019).  Greater than 50% of this patient 31-minute office visit was spent face-to-face in counseling with the patient/family. We discussed medical diagnosis and treatment plan as noted.  Kharee Lesesne Rodman Pickle, MD [facility] 01/15/2019 11:35 AM  Personal pager: [phone] If unanswered, please page CCM On-call: [phone]

## 2019-01-15 NOTE — Telephone Encounter (Signed)
I just sent the message from Dr. Loanne Drilling to Levada Dy and [place] with [employer] as Lenna Sciara is on vacation. Will update again once I receive another response.

## 2019-01-15 NOTE — Telephone Encounter (Signed)
I have received a community message back from [place]. The response I am posting below:  Stenson, [name]  Donis Kotowski, Wesley Baker, CMA; Stenson, [name]; [place], [place]  I am on vacation all this week but wanted to follow up with you about this patient.   Unfortunately, Medicare will not cover oxygen for a patient with diagnosed OSA unless it is still needed after the patient is on therapeutic PAP pressures. We can offer the patient and/or family a private pay quote for O2.   The only way to have MCR cover O2 would be for the patient to go in for an in-lab titration to see if he still desats on CPAP.   I'm including Levada Dy for further follow. She will be able to get hold of me if necessary.   Thank you!  [name]

## 2019-01-15 NOTE — Patient Instructions (Signed)
Moderately severe COPD CONTINUE Symbicort 160-4.5 mcg two puffs twice a day CONTINUE Incruse once daily AS NEEDED Albuterol Duonebs for shortness of breath or wheezing Refer to hospice for end-stage COPD  Nocturnal hypoxemia secondary to OSA START nocturnal oxygen Re-evaluate if patient will tolerate CPAP

## 2019-01-15 NOTE — Telephone Encounter (Signed)
Order has been placed on 12/16 for pt to have nocturnal oxygen. Need to check with [name] to see if this order is still okay for them to use and also let her know the response received from Dr. Loanne Drilling.  Attempted to call [name] to further discuss this with her based on the response received from Dr. Loanne Drilling but unable to reach. I did leave a detailed message for her and also sent her a community message in regards to it as well. Once I receive a response, I will update.

## 2019-01-16 NOTE — Telephone Encounter (Signed)
Patient seen yesterday by Dr Loanne Drilling and referred to Hospice for end-stage COPD.  Nocturnal oxygen started 2L.  Per e-mail from patient's son [name], patient does not qualify for Hospice because "his health hasn't declined enough yet" and that it would help to document his decline to make qualification easier.  Also, [name] included the web address for a portable oxygen concentrator that he has purchased for patient.  Upon looking at the website, unable to discern if this POC provides continuous O2 or pulsed-dose only.  Did pose this question to patient's son [name].  Dr Loanne Drilling please advise, thank you.

## 2019-01-16 NOTE — Telephone Encounter (Signed)
Order placed for palliative care.  [facility] and spoke with nurse Charleston Ropes to see why pt is ineligible for hospice but is eligible for palliative care,[facility] had offered to do an in-home evaluation but had also mentioned the [facility] palliative care to pt's son, and son stated that he would rather take that route for pt's care than straight to hospice.    Forwarding to [place] as FYI.

## 2019-01-16 NOTE — Telephone Encounter (Signed)
Ok to send order for palliative care but please get back to me on why patient does not qualify for hospice. JE

## 2019-01-17 DIAGNOSIS — J449 Chronic obstructive pulmonary disease, unspecified: Secondary | ICD-10-CM

## 2019-01-17 NOTE — Telephone Encounter (Signed)
I spoke with patient and son on phone today. He is willing to tolerate CPAP for his severe OSA. Please follow through with plan as noted below.  Assessment/Plan  Severe OSA Please arrange for patient to receive Auto CPAP 5-20 asap.  Nocturnal hypoxemia secondary to COPD, OSA Patient and family have asked if there are other options for DME aside from [employer]. Can you please have the [place] look into this option for them?  If son decides to obtain oxygen, I recommend continuous 2L O2 overnight for his nocturnal hypoxemia.  Goals of Care Patient initially referred to Hospice however was turned down due to not being ill enough despite his declining respiratory status. --[place] for palliative care referral for now   Rodman Pickle, M.D. [facility] Pulmonary/Critical Care Medicine 01/17/2019 5:44 PM

## 2019-01-17 NOTE — Telephone Encounter (Signed)
Pt's son [name] called and asked for me.  I explained it was Resp Dept at [facility] who needed to speak to him.  He states he spoke to them and they say pt needs titration study and he feels pt can't do that. I told him order for titration study hasn't been placed but if he wants it scheduled to let us know and we can have Dr Loanne Drilling to put in order.  He would be able to stay with pt overnight if he would need assistance. He said O2 out of pocket is 400.00 a month if he doesn't do study.  He is going to call pt's insurance and see if they can waive requirement for titration study.  He will let us know if he needs [place] to do anything.  Will route message back to triage to see if anything else is needed or to close message.

## 2019-01-17 NOTE — Telephone Encounter (Signed)
  I spoke with patient and son on phone today. He is willing to tolerate CPAP for his severe OSA. Please follow through with plan as noted below.  Assessment/Plan  Severe OSA Please arrange for patient to receive Auto CPAP 5-20 asap.  Nocturnal hypoxemia secondary to COPD, OSA Patient and family have asked if there are other options for DME aside from [employer]. Can you please have the [place] look into this option for them?  If son decides to obtain oxygen, I recommend continuous 2L O2 overnight for his nocturnal hypoxemia.  Goals of Care Patient initially referred to Hospice however was turned down due to not being ill enough despite his declining respiratory status. --[place] for palliative care referral for now   Rodman Pickle, M.D. [facility] Pulmonary/Critical Care Medicine 01/17/2019 5:44 PM

## 2019-01-17 NOTE — Telephone Encounter (Signed)
Dr. Loanne Drilling, please see all recent mychart messages as well as info from [place], [facility] and advise on it.

## 2019-01-17 NOTE — Telephone Encounter (Signed)
I am working on this

## 2019-01-17 NOTE — Telephone Encounter (Signed)
PCCs, please see mychart message from pt's son [name] and advise on it. May also need to look at [facility] message from 12/21 as it is also in regards to [place] trying to get O2 for pt.

## 2019-01-17 NOTE — Telephone Encounter (Signed)
Spoke to [place] at [employer].  She states order was sent to Respiratory Dept and they are stating pt will have to have titration study to qualify for O2 or they can offer private pay.  She states per notes Respiratory Dept has been trying to contact son at [phone] and haven't been able to reach anyone.  This is the only contact # on file for pt.  She states Respiratory has sent e-mail back asking if there is another # on file for pt.  Please let son know thru MyChart he can contact [employer] at [phone].

## 2019-01-18 ENCOUNTER — Other Ambulatory Visit: Payer: Self-pay | Admitting: General Surgery

## 2019-01-18 DIAGNOSIS — G4733 Obstructive sleep apnea (adult) (pediatric): Secondary | ICD-10-CM

## 2019-01-18 DIAGNOSIS — G4734 Idiopathic sleep related nonobstructive alveolar hypoventilation: Secondary | ICD-10-CM

## 2019-01-18 NOTE — Telephone Encounter (Signed)
The palliative care order was already placed.  Placed order for CPAP machine with direction to not sent to [employer] per patient/family request. Flagged as urgent. Nothing further needed at this time.

## 2019-01-29 DIAGNOSIS — Z7189 Other specified counseling: Secondary | ICD-10-CM | POA: Insufficient documentation

## 2019-01-30 NOTE — Addendum Note (Signed)
Addended by: Amado Coe on: 01/30/2019 10:03 AM   Modules accepted: Orders

## 2019-01-31 ENCOUNTER — Telehealth: Payer: Self-pay | Admitting: Adult Health Nurse Practitioner

## 2019-01-31 NOTE — Telephone Encounter (Signed)
Spoke with patient's son [name] regarding Palliative services and he stated that someone had already came out to see patient for Palliative services, he thought it was through [place].  I told him that I would check into this and call him back.  I called [facility], since Dr. Cordelia Pen office had told us that patient had been assessed for Hospice services there and did not qualify for Hospice services.  When I called them to verify if this patient was under their Palliative service she stated that he was and had been admitted to the Palliative program (through [place]) on 01/23/19.  I called son back to let him know and told him that we would cancel the referral and he was in agreement with this.

## 2019-02-03 ENCOUNTER — Other Ambulatory Visit: Payer: Self-pay | Admitting: Cardiovascular Disease

## 2019-02-05 NOTE — Procedures (Signed)
Overnight Oximetry Report  Reviewed report from 12/20/18. Patient with SpO2 <88% for 1hour 41 min 36 sec. [place] O2 74%. Patient qualifies for nocturnal oxygen.  Will arrange for 2L O2 via [place] while sleeping.  Rodman Pickle, M.D. [facility] Pulmonary/Critical Care Medicine 01/09/2019 1:13 PM

## 2019-02-06 DIAGNOSIS — G4734 Idiopathic sleep related nonobstructive alveolar hypoventilation: Secondary | ICD-10-CM

## 2019-02-07 ENCOUNTER — Telehealth: Payer: Self-pay | Admitting: Pulmonary Disease

## 2019-02-07 NOTE — Telephone Encounter (Signed)
Per Jeneen Rinks from [employer] there is a big issue with this patient with his 02 and CPAP.  Patient was seen on 09/25/2018 and ONO order was placed on 09/28/18 to [facility]. ONO results done 10/06/18. ONO was reviewed by Dr. Loanne Drilling on 11/28/18.  02 order was placed on 12/01/18 to [facility] but the ONO was too old to order the night time 02.  Patient was seen 11/14/18 with witnessed apneas and Dr. Loanne Drilling order home sleep test. [employer] stated per message on 12/06/18 that ONO needed to be repeated. New ONO order was placed on 12/06/18 and ONO was done on 12/20/18.  In the mean time the HST was done on 12/12/18. CPAP setup was ordered on 01/01/19. New order for 02 at night was placed again on 12/15 and 01/10/19. Another CPAP setup order was placed on 01/18/19 and sent to [facility].  On 02/07/19 a new order for 02 at night was sent to [facility].  Since the patient has now been diagnosed with OSA his insurance will not pay for the night time 02.  If the 02 was ordered before he was diagnosed with OSA it wouldn't have made any difference.  Per Jeneen Rinks with [employer] the only way to get 02 for the patient would be to bring him back into the office and prove he needs 02 continuous or send the patient to the sleep to have CPAP Titration done.  Please advise what you would like do  Walk in office or Cpap Titration? el

## 2019-02-07 NOTE — Telephone Encounter (Signed)
Pulmonary and Critical Care Note  Patient had trial of CPAP without success. Unable to tolerate facemask. He has documented nocturnal hypoxemia. Please request DME to supply 2L via [place] nightly.  Rodman Pickle, M.D. [facility] Pulmonary/Critical Care Medicine 02/07/2019 5:14 AM

## 2019-02-10 NOTE — Telephone Encounter (Signed)
Patient has nocturnal hypoxemia that has been documented multiple times. He does not need continuous O2 as his resting O2 is >95% during the day. He is unable to tolerate CPAP so performing a CPAP titration would be futile.   What steps need to be taken to order NOCTURNAL OXYGEN? Please have DME clarify the timing of face to face visit with overnight O2. Patient does not live within close proximity with our clinic and the results of his overnight O2 are always cutting close to the time of our visits due to delayed result reporting. Meanwhile, patient continues to remain hypoxemic at night without adequate treatment. Please make DME aware that we are delaying care for this patient.  Staff, please call me STAT on Monday once DME is contacted with the above answers so we can appropriately care for this patient.  Rodman Pickle, M.D. [facility] Pulmonary/Critical Care Medicine 02/10/2019 4:41 PM

## 2019-02-12 NOTE — Telephone Encounter (Signed)
I called and spoke with Jeneen Rinks with [employer] and he basically states that because he has a diagnosis of OSA insurance will not pay for the nocturnal oxygen alone with just the ONO testing. Even if he can not tolerate CPAP he still needs a CPAP titration to show the insurance company that he de-sats while sleeping. He states that because the OSA diagnosis came first the insurance company is going to want that treated first.

## 2019-02-16 NOTE — Telephone Encounter (Signed)
[facility]monary Note 02/16/19  I called and spoke with [name]'s son and caregiver, Wesley Baker, who expresses concern regarding his father's overall declining status. His hypoxemia is of significant issue as he is unable to tolerate his CPAP related to his declining cognitive function but will wear his oxygen with direct supervision by his son. 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   I recommend re-evaluation by Hospice. In the time that I have cared for [name], his cognitive function has declined and would be in worse condition if it were not for the excellent care provided by his son. It is of my medical opinion that patient's condition will continue to deteriorate and he would be a poor candidate for aggressive management. He is currently DNR which is appropriate.  I believe patient would benefit from Hospice services as well as the son, who is experiencing caregiver fatigue. Discussed this with staff at [facility] who have requested records.  Plan Staff, please fax the following notes: This note from 02/16/19 Pulm progress 01/15/19 Pulm progress note 11/14/19 Initial Cardiology consult note from 10/04/18  ATTN: Juliann Pulse at [facility] Fax No. [phone]  Rodman Pickle, M.D. [facility] Pulmonary/Critical Care Medicine 02/16/2019 5:49 PM

## 2019-02-19 ENCOUNTER — Telehealth: Payer: Self-pay | Admitting: Pulmonary Disease

## 2019-02-19 NOTE — Telephone Encounter (Signed)
All records per Dr. Loanne Drilling have been printed and faxed over to [facility], printed records have been placed in [place] for follow up  Nothing further needed at this time.

## 2019-02-21 NOTE — Telephone Encounter (Signed)
Dr Loanne Drilling please see email sent from pt's family:   Dr. Loanne Drilling,  we met with Hospice today and everything went well.  I believe it will be real helpful. Thank you for everything. [name]

## 2019-02-27 NOTE — Telephone Encounter (Signed)
Noted. Message has been sent to Dr. Loanne Drilling to make her aware.

## 2019-03-20 ENCOUNTER — Telehealth: Payer: Self-pay

## 2019-03-20 NOTE — Telephone Encounter (Signed)
Contacted patient- spoke to son, he wanted to cancel the appointment for Thursday as his dad is now under hospice care at the house, and they are taking care of him.   I advised I would cancel, and notify Dr.O'Neal. Son was thankful for the call.

## 2019-03-20 NOTE — Telephone Encounter (Signed)
Sounds good. I figured he would end up in hospice soon.   Evalina Field, MD

## 2019-03-20 NOTE — Telephone Encounter (Signed)
-----   Message from Geralynn Rile, MD sent at 03/20/2019  7:14 AM EST ----- Almyra Free:  I may have told you about this yesterday, but Mr. Heyser is scheduled for an in person appointment 2/25. We need to get him switched over to virtual. I assume he will be ok with this.   Evalina Field, MD

## 2019-03-22 ENCOUNTER — Ambulatory Visit: Payer: Medicare HMO | Admitting: Cardiovascular Disease

## 2019-05-02 ENCOUNTER — Telehealth: Payer: Self-pay | Admitting: Pulmonary Disease

## 2019-05-02 NOTE — Telephone Encounter (Signed)
I will be back in the office on 05/07/19 to sign

## 2019-05-02 NOTE — Telephone Encounter (Signed)
Thank you for coordinating the card. I was thankful to participate in his care.   Aaron Edelman

## 2019-05-02 NOTE — Telephone Encounter (Signed)
Will have Aaron Edelman to sign the card tomorrow and hold card until Dr. Loanne Drilling is back in the office to sign it as well.

## 2019-05-02 NOTE — Telephone Encounter (Signed)
Spoke with [place] from Hospice. She stated that the patient had declined rapidly the past week. He began to have episodes of SOB starting on Saturday. She was able to do a home visit on Monday and could tell that the patient was starting the transition process. She was off yesterday but was notified that the patient had passed away last shortly after 10pm.   She spoke with the patient's daughter this morning. She expressed gratitude to the entire pulmonary staff for their help with her Dad.   Jinny Blossom also advised that hospice would sending over the final orders to be signed within the next week. I advised her that we would be ok the lookout for this.   Will prepare a condolence card for the patient's family.   Will route to Dr. Loanne Drilling and Aaron Edelman as a FYI.

## 2019-05-15 NOTE — Telephone Encounter (Signed)
Message received this morning for you.   Dr. Loanne Drilling,  I just wanted to thank you for all the care you gave my dad.  I'm so thankful you got him in Hospice so he could pass here at home with me by his side.  He went peacefully.  You gave him the best care I've ever seen.  You gave [place] more time with him. I'll never forget what you've done for [place]. Lorel Monaco

## 2019-05-26 DEATH — deceased

## 2019-09-01 IMAGING — CT CT CHEST W/O CM
2 of 3 series · 15 of 36 positions shown, 18 images · non-contrast
Comparison: 11/25/2017 CT and prior radiographs

CLINICAL DATA: [AGE] male with chronic shortness of breath.
Follow-up LOWER lung opacities.

EXAM:
CT CHEST WITHOUT CONTRAST
TECHNIQUE: Multidetector CT imaging of the chest was performed following the
standard protocol without IV contrast.

[Series 2: thorax · axial · 0.79mm/px · z∈[+1048,+1302]mm · 12 of 151 slices shown, 15 images]
[im 12/151  mediastinal]
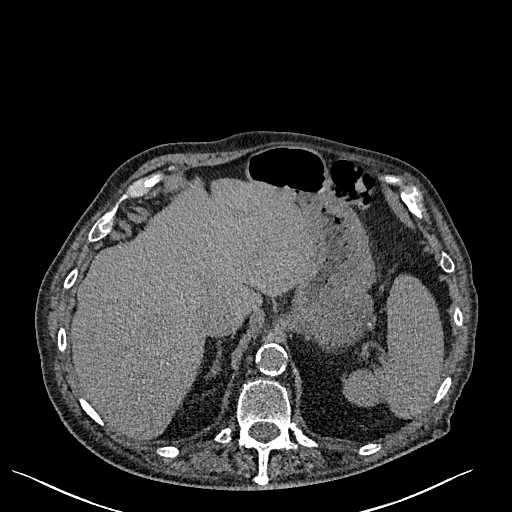
[im 12/151  lung]
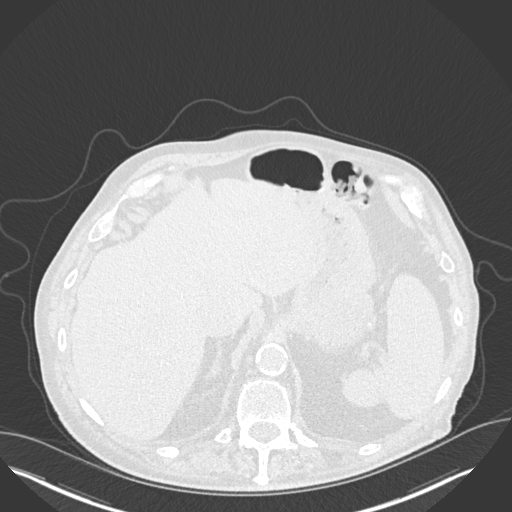
[im 23/151  lung]
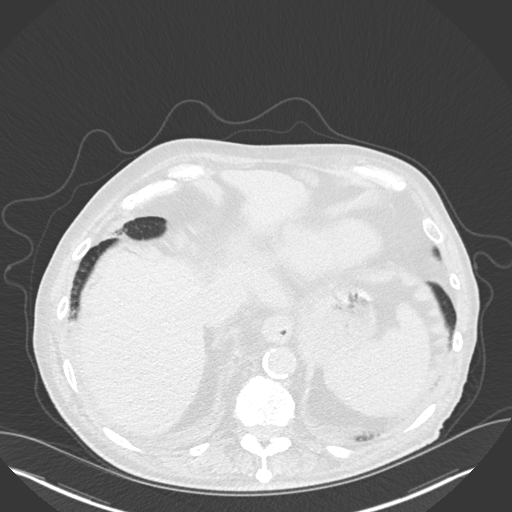
[im 34/151  lung]
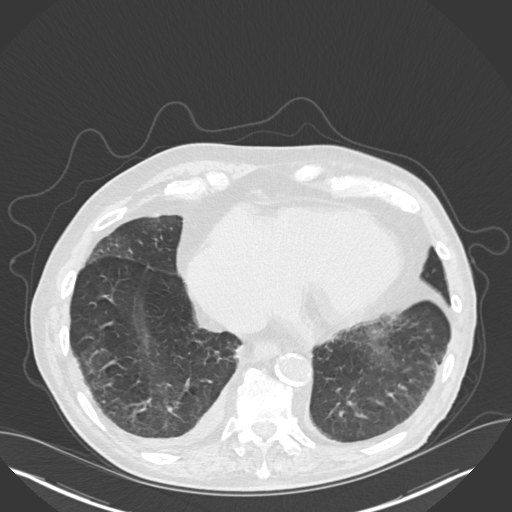
[im 45/151  lung]
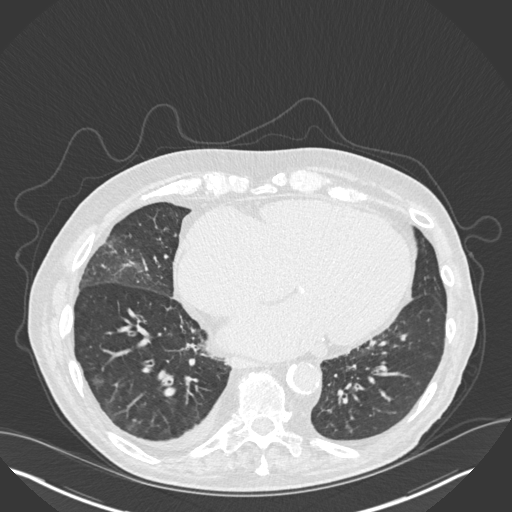
[im 56/151  mediastinal]
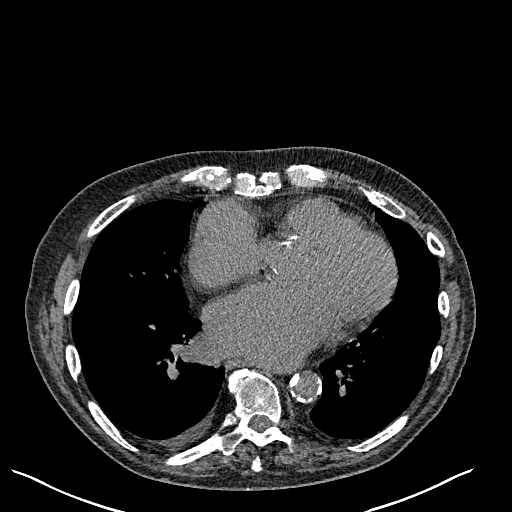
[im 56/151  lung]
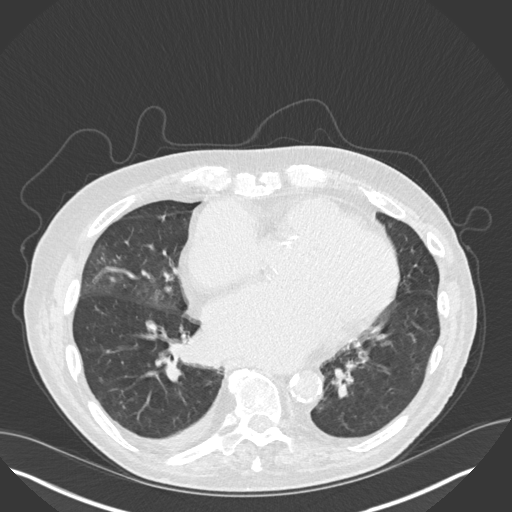
[im 67/151  lung]
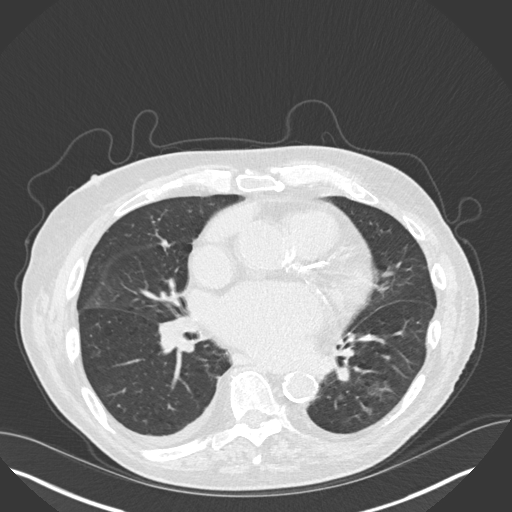
[im 84/151  lung]
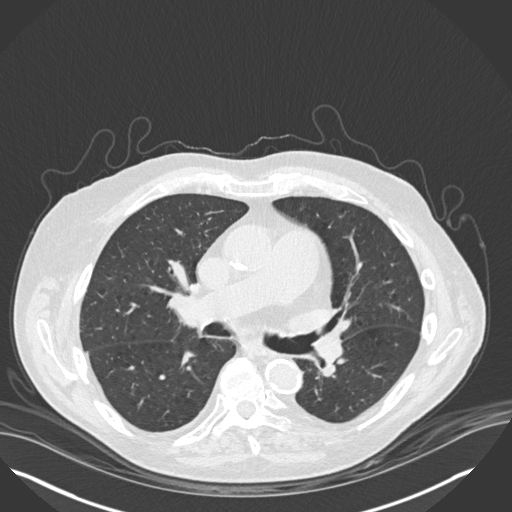
[im 95/151  lung]
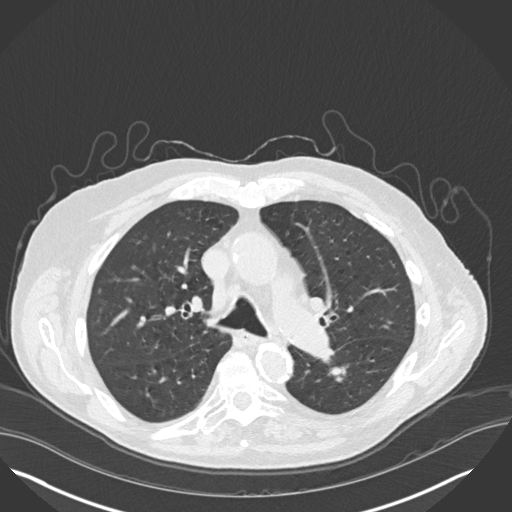
[im 106/151  mediastinal]
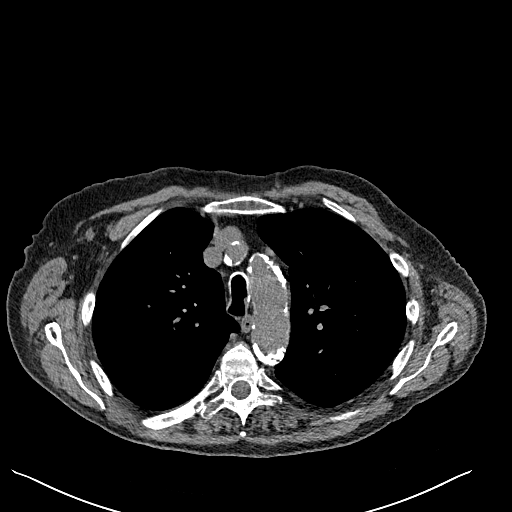
[im 106/151  lung]
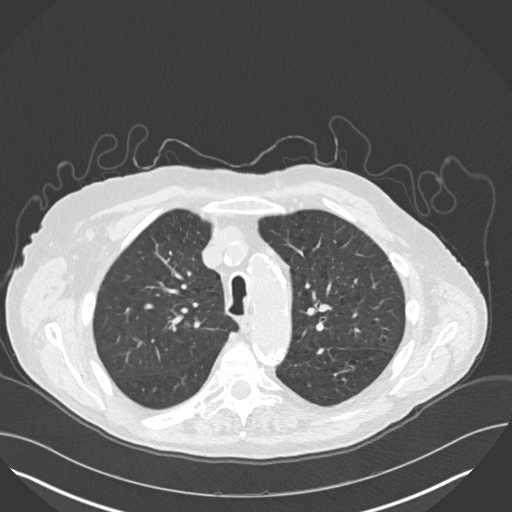
[im 117/151  lung]
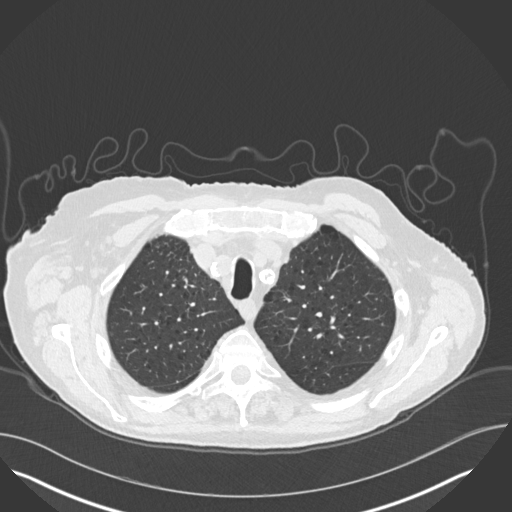
[im 128/151  lung]
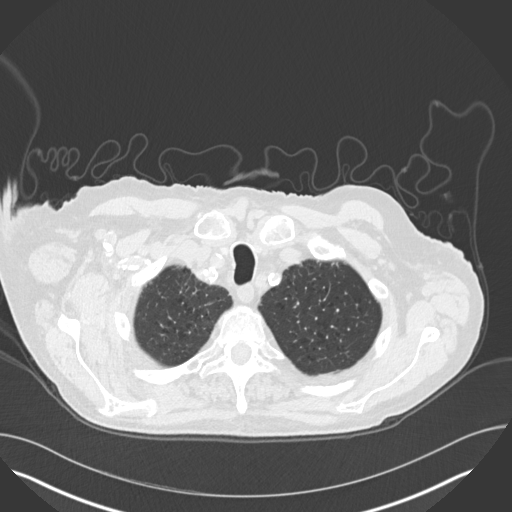
[im 139/151  lung]
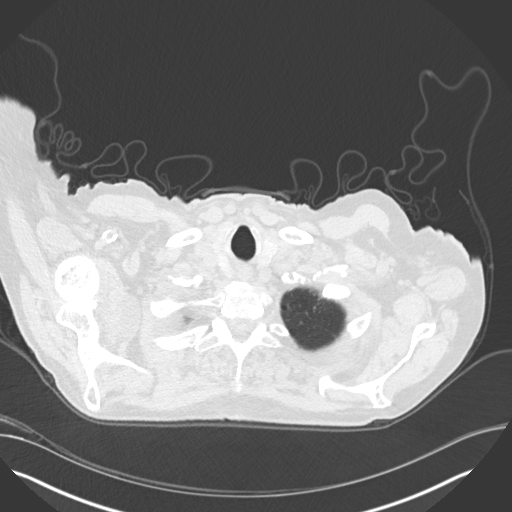

[Series 5: coronal · coronal · 0.62mm/px · 3 of 133 slices shown]
[im 27/133  lung]
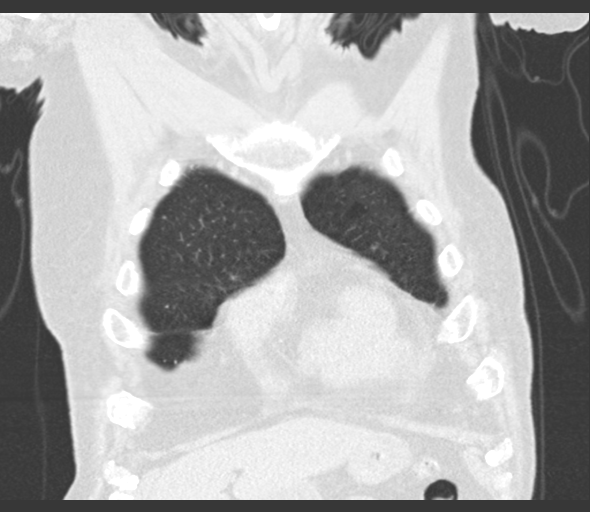
[im 53/133  lung]
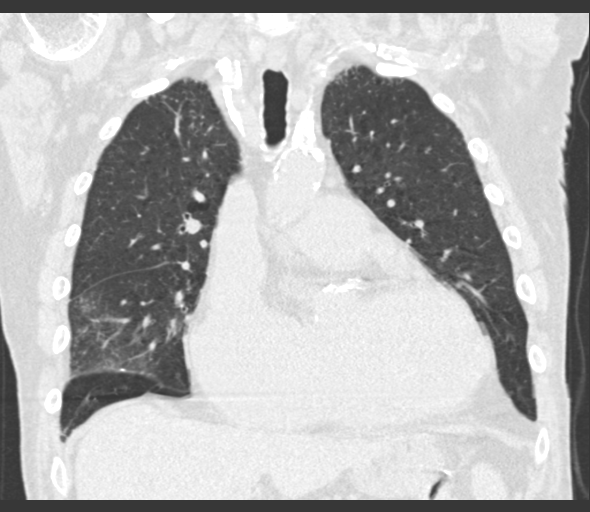
[im 80/133  lung]
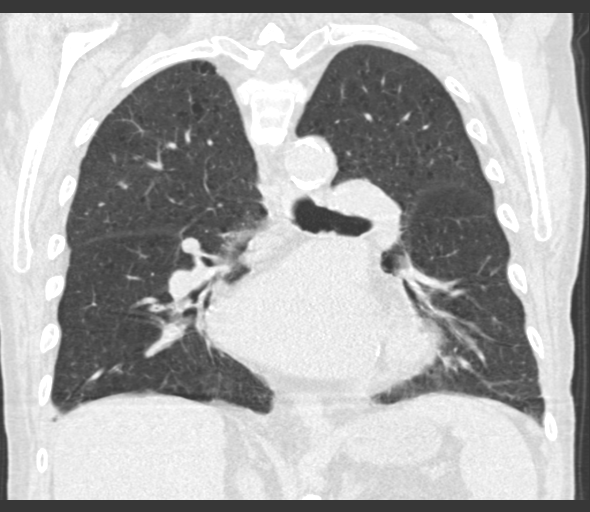

[15 of 36 positions shown; findings below may reference images not displayed]

FINDINGS: Cardiovascular: Moderate to marked cardiomegaly again identified.
Heavy coronary artery and aortic atherosclerotic calcifications
again identified. There is no evidence of thoracic aortic aneurysm
or pericardial effusion.

Mediastinum/Nodes: No enlarged mediastinal or axillary lymph nodes.
Thyroid gland, trachea, and esophagus demonstrate no significant
findings.

Lungs/Pleura: Areas of consolidation within the LOWER lungs on the
prior study have essentially resolved. Trace bilateral pleural
effusions are noted, decreased from the prior study. No suspicious
mass, airspace disease or consolidation identified. Mild to moderate
centrilobular emphysema is again identified. Calcified RIGHT UPPER
lobe nodule again noted.

Upper Abdomen: No acute abnormality

Musculoskeletal: No acute or suspicious bony abnormalities.
IMPRESSION: 1. Resolution of bilateral LOWER lung consolidation since 11/25/2017
CT, compatible with resolved inflammatory or infectious process.
2. Decreased bilateral pleural effusions, now trace.
3. Moderate to marked cardiomegaly and coronary artery disease.
4. Aortic Atherosclerosis (16DMX-Q3L.L) and Emphysema (16DMX-GZP.0).

## 2019-09-18 IMAGING — DX DG CHEST 2V
2 series · 2 of 2 positions shown · non-contrast
Comparison: CT chest 11/25/2017 and 02/03/2018. PA and lateral
chest 11/14/2017 and 05/06/2015..

CLINICAL DATA: History of pneumonia.  Former cigarette smoker.

EXAM:
CHEST - 2 VIEW

[chest pa]
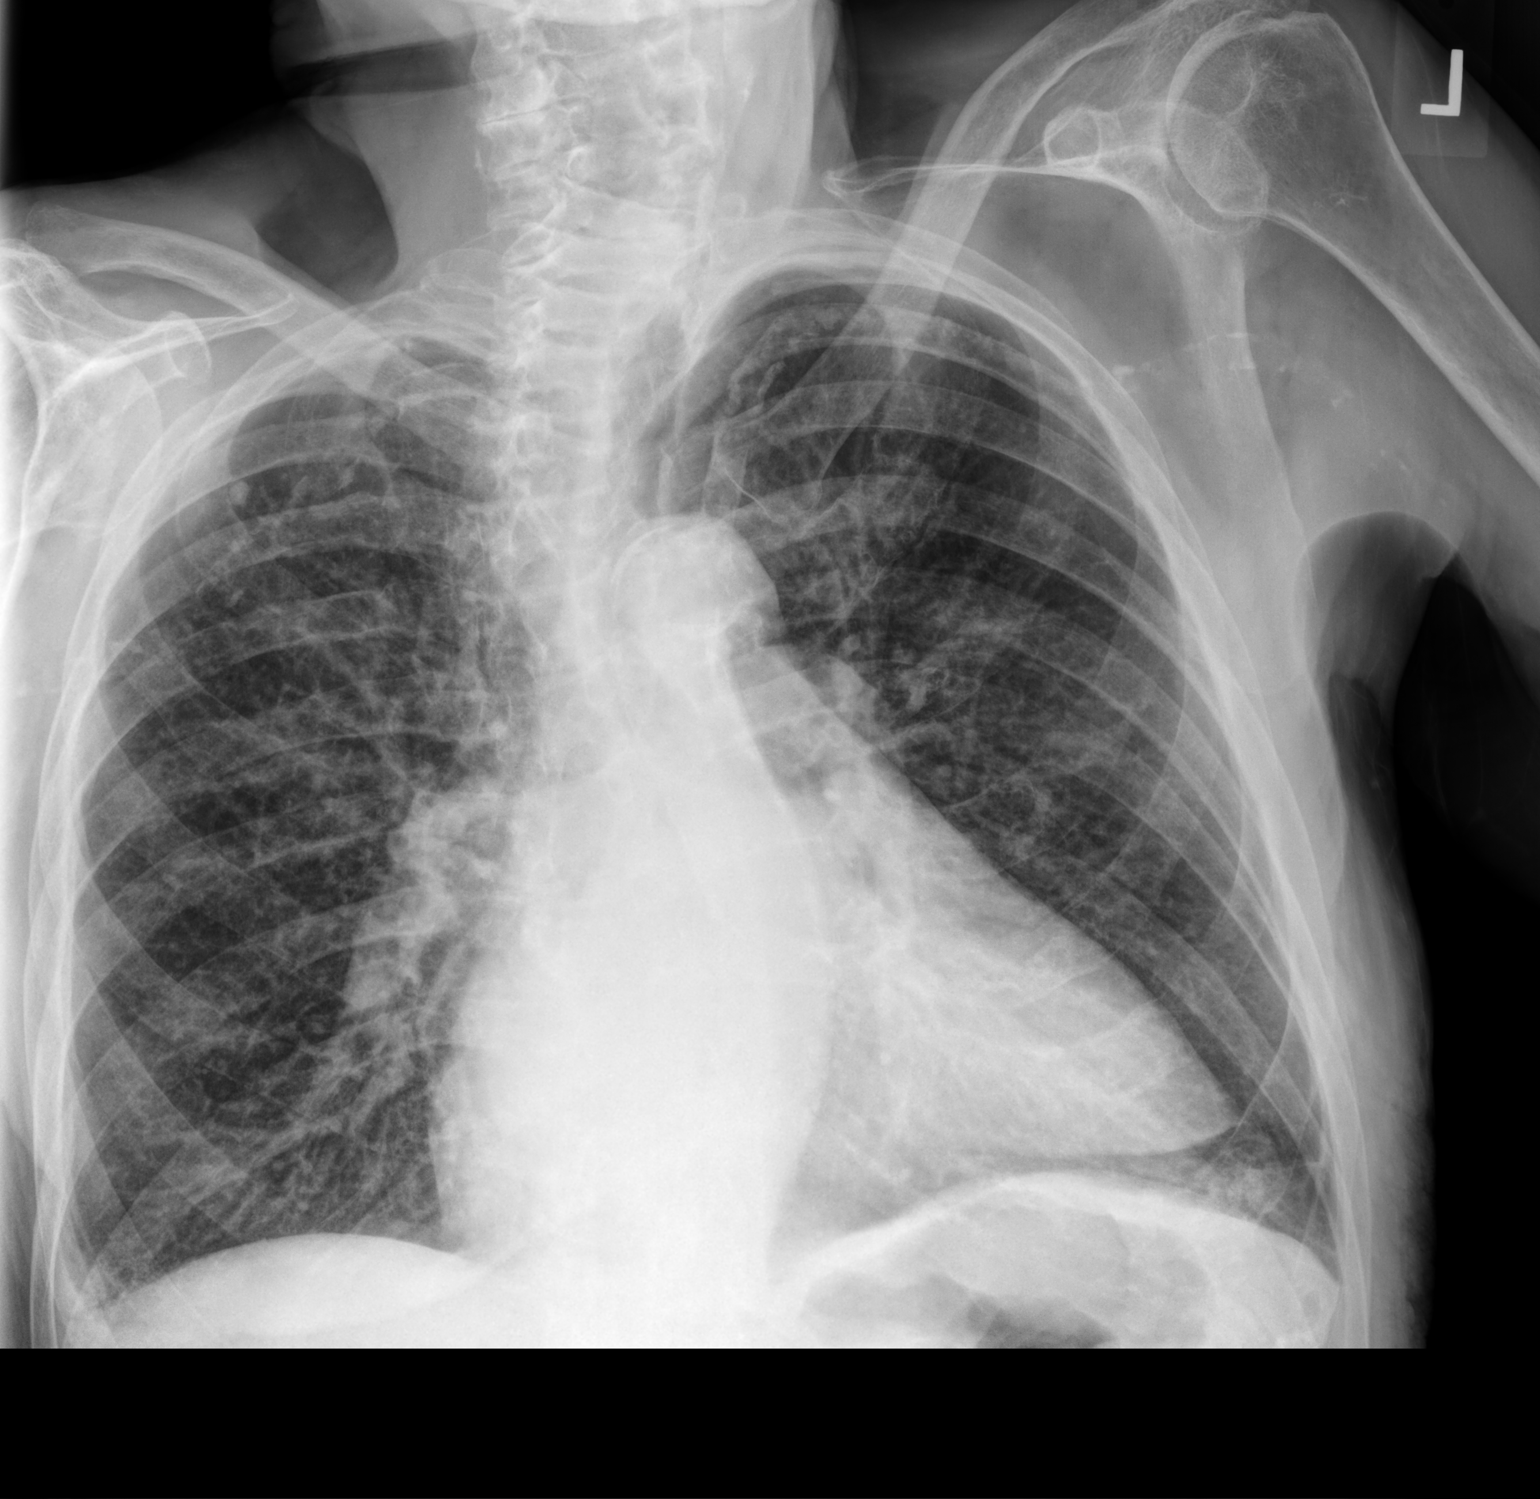

[chest lat]
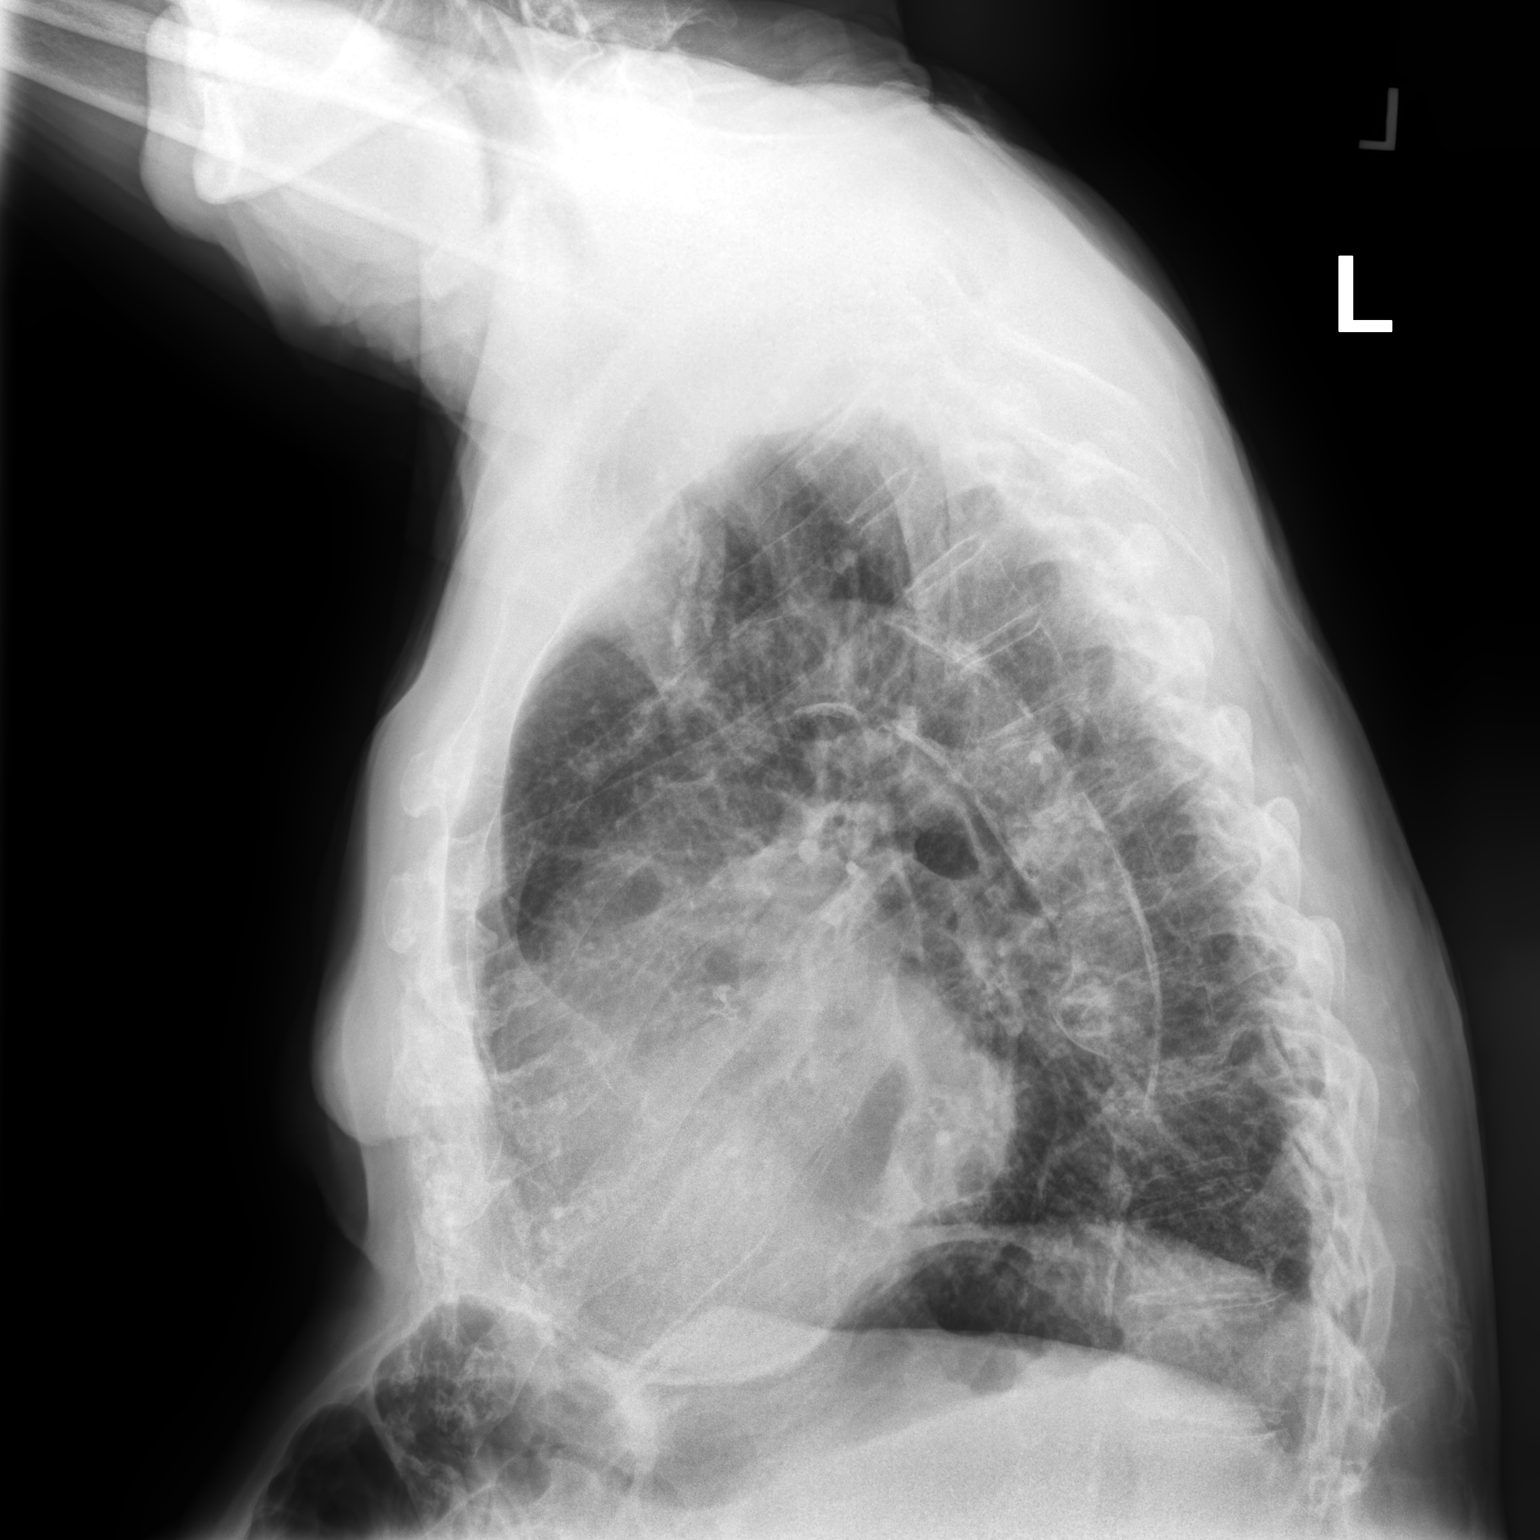

[2 of 2 positions shown; findings below may reference images not displayed]

FINDINGS: Marked cardiomegaly and atherosclerosis are again seen. The lungs
are emphysematous. No pneumothorax or pleural effusion. Small focus
of subsegmental atelectasis left lung base noted. No acute or focal
bony abnormality.
IMPRESSION: No acute disease.

Cardiomegaly.

Emphysema.

Atherosclerosis.
